# Patient Record
Sex: Female | Born: 1960 | Race: Black or African American | Hispanic: No | Marital: Single | Smoking: Never smoker
Health system: Southern US, Community
[De-identification: ages and names within clinical notes are randomized; demographics above are authoritative.]

## PROBLEM LIST (undated history)

## (undated) DIAGNOSIS — J45909 Unspecified asthma, uncomplicated: Secondary | ICD-10-CM

## (undated) DIAGNOSIS — I4891 Unspecified atrial fibrillation: Secondary | ICD-10-CM

## (undated) DIAGNOSIS — J449 Chronic obstructive pulmonary disease, unspecified: Secondary | ICD-10-CM

## (undated) DIAGNOSIS — I340 Nonrheumatic mitral (valve) insufficiency: Secondary | ICD-10-CM

## (undated) DIAGNOSIS — K5792 Diverticulitis of intestine, part unspecified, without perforation or abscess without bleeding: Secondary | ICD-10-CM

## (undated) DIAGNOSIS — I10 Essential (primary) hypertension: Secondary | ICD-10-CM

## (undated) DIAGNOSIS — M1990 Unspecified osteoarthritis, unspecified site: Secondary | ICD-10-CM

## (undated) DIAGNOSIS — G4730 Sleep apnea, unspecified: Secondary | ICD-10-CM

## (undated) DIAGNOSIS — T7840XA Allergy, unspecified, initial encounter: Secondary | ICD-10-CM

## (undated) HISTORY — PX: OTHER SURGICAL HISTORY: SHX169

## (undated) HISTORY — PX: ABDOMINAL HYSTERECTOMY: SHX81

## (undated) HISTORY — DX: Allergy, unspecified, initial encounter: T78.40XA

## (undated) HISTORY — DX: Essential (primary) hypertension: I10

## (undated) HISTORY — DX: Unspecified asthma, uncomplicated: J45.909

## (undated) HISTORY — PX: HEEL SPUR EXCISION: SHX1733

## (undated) HISTORY — DX: Sleep apnea, unspecified: G47.30

---

## 2004-10-09 ENCOUNTER — Inpatient Hospital Stay: Payer: Self-pay | Admitting: Obstetrics & Gynecology

## 2004-12-06 ENCOUNTER — Emergency Department: Payer: Self-pay | Admitting: Internal Medicine

## 2005-02-20 ENCOUNTER — Emergency Department: Payer: Self-pay | Admitting: Emergency Medicine

## 2005-12-12 ENCOUNTER — Emergency Department: Payer: Self-pay | Admitting: Emergency Medicine

## 2006-06-03 ENCOUNTER — Emergency Department: Payer: Self-pay | Admitting: Emergency Medicine

## 2006-08-29 ENCOUNTER — Ambulatory Visit: Payer: Self-pay | Admitting: Orthopedic Surgery

## 2006-09-26 ENCOUNTER — Ambulatory Visit: Payer: Self-pay | Admitting: Orthopedic Surgery

## 2006-10-09 ENCOUNTER — Ambulatory Visit: Payer: Self-pay | Admitting: Orthopedic Surgery

## 2006-10-11 ENCOUNTER — Emergency Department: Payer: Self-pay | Admitting: Unknown Physician Specialty

## 2006-10-12 ENCOUNTER — Other Ambulatory Visit: Payer: Self-pay

## 2006-12-26 ENCOUNTER — Emergency Department: Payer: Self-pay | Admitting: Emergency Medicine

## 2007-08-14 ENCOUNTER — Emergency Department: Payer: Self-pay | Admitting: Internal Medicine

## 2007-08-20 ENCOUNTER — Emergency Department: Payer: Self-pay | Admitting: Emergency Medicine

## 2008-03-26 ENCOUNTER — Emergency Department: Payer: Self-pay

## 2008-03-26 ENCOUNTER — Other Ambulatory Visit: Payer: Self-pay

## 2008-09-04 ENCOUNTER — Emergency Department: Payer: Self-pay | Admitting: Emergency Medicine

## 2012-08-26 ENCOUNTER — Emergency Department: Payer: Self-pay | Admitting: Internal Medicine

## 2012-08-26 LAB — BASIC METABOLIC PANEL
Anion Gap: 7 (ref 7–16)
BUN: 20 mg/dL — ABNORMAL HIGH (ref 7–18)
Calcium, Total: 9 mg/dL (ref 8.5–10.1)
Co2: 30 mmol/L (ref 21–32)
Glucose: 101 mg/dL — ABNORMAL HIGH (ref 65–99)
Osmolality: 284 (ref 275–301)
Sodium: 141 mmol/L (ref 136–145)

## 2012-08-26 LAB — CBC
HGB: 13.1 g/dL (ref 12.0–16.0)
MCH: 27.5 pg (ref 26.0–34.0)
MCHC: 32.6 g/dL (ref 32.0–36.0)
MCV: 84 fL (ref 80–100)
RBC: 4.75 10*6/uL (ref 3.80–5.20)
RDW: 15.1 % — ABNORMAL HIGH (ref 11.5–14.5)
WBC: 8.4 10*3/uL (ref 3.6–11.0)

## 2013-09-20 ENCOUNTER — Emergency Department: Payer: Self-pay | Admitting: Emergency Medicine

## 2013-11-13 ENCOUNTER — Emergency Department: Payer: Self-pay | Admitting: Emergency Medicine

## 2013-11-22 ENCOUNTER — Ambulatory Visit (INDEPENDENT_AMBULATORY_CARE_PROVIDER_SITE_OTHER): Payer: No Typology Code available for payment source

## 2013-11-22 ENCOUNTER — Ambulatory Visit (INDEPENDENT_AMBULATORY_CARE_PROVIDER_SITE_OTHER): Payer: No Typology Code available for payment source | Admitting: Podiatry

## 2013-11-22 ENCOUNTER — Encounter: Payer: Self-pay | Admitting: Podiatry

## 2013-11-22 ENCOUNTER — Other Ambulatory Visit: Payer: Self-pay | Admitting: *Deleted

## 2013-11-22 VITALS — BP 142/92 | HR 85 | Resp 16 | Ht 62.0 in | Wt 315.0 lb

## 2013-11-22 DIAGNOSIS — M778 Other enthesopathies, not elsewhere classified: Secondary | ICD-10-CM

## 2013-11-22 DIAGNOSIS — M779 Enthesopathy, unspecified: Secondary | ICD-10-CM

## 2013-11-22 DIAGNOSIS — M7750 Other enthesopathy of unspecified foot: Secondary | ICD-10-CM

## 2013-11-22 DIAGNOSIS — Q6650 Congenital pes planus, unspecified foot: Secondary | ICD-10-CM

## 2013-11-22 DIAGNOSIS — M1990 Unspecified osteoarthritis, unspecified site: Secondary | ICD-10-CM

## 2013-11-22 MED ORDER — MELOXICAM 15 MG PO TABS: 15.0000 mg | ORAL_TABLET | Freq: Every day | ORAL | Status: DC

## 2013-11-22 NOTE — Progress Notes (Signed)
   Subjective:    Patient ID: Molly Rivera, female    DOB: 20-Mar-1961, 53 y.o.   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 Left foot the pain is going up the back of the leg. Having a hard time sleeping. Went to emergency room and they gave medication to help the pain   Foot Pain      Review of Systems  All other systems reviewed and are negative.      Objective:   Physical Exam: I have reviewed her past medical history medications allergies surgeries social history and review of systems. Pulses are strongly palpable bilateral. Neurologic sensorium is intact per since once the monofilament deep tendon reflexes are intact bilateral muscle strength is 5 over 5 dorsiflexors plantar flexors inverters everters all intrinsic musculature is intact. Orthopedic evaluation demonstrates all joints distal to the ankle have a full range of motion without crepitation. She has pes planus bilateral which flexible in nature but she does have pain of Lisfranc's joint on internal and external rotation of the forefoot. Radiographic evaluation demonstrates osteoarthritis with pes planus bilateral.        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  Plan: Injected dorsal aspect of the bilateral foot with Kenalog and local anesthetic today she was scanned for orthotics. And [facility]

## 2013-11-30 ENCOUNTER — Emergency Department: Payer: Self-pay | Admitting: Emergency Medicine

## 2013-11-30 LAB — CBC WITH DIFFERENTIAL/PLATELET
BASOS PCT: 0.5 %
Basophil #: 0.1 10*3/uL (ref 0.0–0.1)
Eosinophil #: 0.2 10*3/uL (ref 0.0–0.7)
Eosinophil %: 2.1 %
HCT: 38.9 % (ref 35.0–47.0)
HGB: 12.6 g/dL (ref 12.0–16.0)
LYMPHS ABS: 3.2 10*3/uL (ref 1.0–3.6)
LYMPHS PCT: 27.5 %
MCH: 27.9 pg (ref 26.0–34.0)
MCHC: 32.4 g/dL (ref 32.0–36.0)
MCV: 86 fL (ref 80–100)
Monocyte #: 0.8 x10 3/mm (ref 0.2–0.9)
Monocyte %: 7.1 %
NEUTROS ABS: 7.4 10*3/uL — AB (ref 1.4–6.5)
NEUTROS PCT: 62.8 %
PLATELETS: 269 10*3/uL (ref 150–440)
RBC: 4.52 10*6/uL (ref 3.80–5.20)
RDW: 16 % — AB (ref 11.5–14.5)
WBC: 11.7 10*3/uL — ABNORMAL HIGH (ref 3.6–11.0)

## 2013-11-30 LAB — BASIC METABOLIC PANEL
ANION GAP: 5 — AB (ref 7–16)
BUN: 20 mg/dL — ABNORMAL HIGH (ref 7–18)
Calcium, Total: 8.9 mg/dL (ref 8.5–10.1)
Chloride: 106 mmol/L (ref 98–107)
Co2: 27 mmol/L (ref 21–32)
Creatinine: 1.18 mg/dL (ref 0.60–1.30)
EGFR (African American): >60
GFR CALC NON AF AMER: 54 — AB
Glucose: 99 mg/dL (ref 65–99)
Osmolality: 278 (ref 275–301)
POTASSIUM: 3.9 mmol/L (ref 3.5–5.1)
Sodium: 138 mmol/L (ref 136–145)

## 2013-12-01 ENCOUNTER — Telehealth: Payer: Self-pay | Admitting: *Deleted

## 2013-12-01 NOTE — Telephone Encounter (Signed)
PT CALLED SAID SHE IS HAVING SPASMS IN BOTH OF HER FEET AND INTO HER LEFT LEG. SAID SHE IS TAKING MOBIC 7.5 MG FOR THE LAST 2 WEEKS GIVEN TO HER BY THE EMERGENCY ROOM AND YOU HAD GIVEN HER MOBIC 15 MG BUT SHE HAS NOT STARTED THEM AS OF NOW. SHE WENT TO THE EMERGENCY ROOM LAST NIGHT BUT THEY DO NOT KNOW WHY SHE IS HAVING THE SPASMS. WANTS TO KNOW IF THE MOBIC COULD BE THE CAUSE. STATED SHE HAD THE SPASMS REALLY BAD LAST NIGHT AND HAVING THEM OFF AND ON FOR THE LAST WEEK.

## 2013-12-01 NOTE — Telephone Encounter (Signed)
I would suggest that if that is the only thing new that she is taking then she should stop the medication.  Wait to see if the spasms subside.  I will follow up with her and we can consider changing the medication on that follow up.

## 2013-12-02 NOTE — Telephone Encounter (Signed)
Spoke with pt regarding mobic 7.5 mg. Pt states the only other medication she is taking is her hbp rx and has been taking that for quite a while. Per dr Al Corpushyatt told pt to stop taking the mobic and see if spasms subside and will see her at next appt and see about changing the mobic rx. Pt understood.

## 2013-12-03 ENCOUNTER — Telehealth: Payer: Self-pay | Admitting: Podiatry

## 2013-12-03 NOTE — Telephone Encounter (Signed)
Molly Rivera called this morning in reference to the mobic. She stated she had taken Mobic 15 mg before she spoke to [name] yesterday and that she had no spasm's yesterday. I relayed this information to [facility] and she said it would be okay for Molly Rivera to continue the Mobic 15 mg as long as she had no spasum's. If she started having them again to discontinue medication and contact our office.

## 2013-12-06 ENCOUNTER — Encounter: Payer: Self-pay | Admitting: *Deleted

## 2013-12-06 NOTE — Progress Notes (Signed)
Sent pt post card letting her know orthotics are here. 

## 2013-12-16 ENCOUNTER — Other Ambulatory Visit: Payer: Self-pay | Admitting: *Deleted

## 2013-12-16 ENCOUNTER — Telehealth: Payer: Self-pay | Admitting: *Deleted

## 2013-12-16 MED ORDER — DICLOFENAC SODIUM 75 MG PO TBEC: 75.0000 mg | DELAYED_RELEASE_TABLET | Freq: Two times a day (BID) | ORAL | Status: DC

## 2013-12-16 MED ORDER — PREDNISONE 10 MG PO KIT: PACK | ORAL | Status: DC

## 2013-12-16 NOTE — Telephone Encounter (Signed)
Pt called and is wanting pain medicine for her feet. Can not get an appt till 6.15.15 to pick up orthotics with dr Al Corpus. She was given mobic and pt was thinking it was giving her spasms. She stopped mobic but still is having spasms. What do you suggest?

## 2013-12-16 NOTE — Telephone Encounter (Signed)
SENT IN STERAPRED KIT AND DICLOFENAC TO [facility] BY DR Valentina Lucks.

## 2013-12-16 NOTE — Telephone Encounter (Signed)
mobic does not cause spasms so its likely from her foot problem versus a medication.  I can put her on a sterapred kit followed by diclofenac antiinflammatory.  No narcotics will be written for pain .

## 2014-01-03 ENCOUNTER — Ambulatory Visit (INDEPENDENT_AMBULATORY_CARE_PROVIDER_SITE_OTHER): Payer: No Typology Code available for payment source | Admitting: Podiatry

## 2014-01-03 ENCOUNTER — Encounter: Payer: Self-pay | Admitting: Podiatry

## 2014-01-03 DIAGNOSIS — Q6650 Congenital pes planus, unspecified foot: Secondary | ICD-10-CM

## 2014-01-03 NOTE — Patient Instructions (Signed)

## 2014-01-03 NOTE — Progress Notes (Signed)
Both oral and written home-going instructions were provided for the use of her orthotics. I will followup with her in one month.

## 2014-02-07 ENCOUNTER — Ambulatory Visit: Payer: No Typology Code available for payment source | Admitting: Podiatry

## 2014-05-16 ENCOUNTER — Emergency Department: Payer: Self-pay | Admitting: Emergency Medicine

## 2014-06-05 ENCOUNTER — Emergency Department: Payer: Self-pay | Admitting: Emergency Medicine

## 2014-06-25 ENCOUNTER — Emergency Department: Payer: Self-pay | Admitting: Student

## 2014-09-20 ENCOUNTER — Emergency Department: Payer: Self-pay | Admitting: Emergency Medicine

## 2014-12-08 ENCOUNTER — Emergency Department
Admission: EM | Admit: 2014-12-08 | Discharge: 2014-12-08 | Disposition: A | Discharge status: Home / Self Care | Payer: No Typology Code available for payment source | Attending: Emergency Medicine | Admitting: Emergency Medicine

## 2014-12-08 ENCOUNTER — Encounter: Payer: Self-pay | Admitting: Emergency Medicine

## 2014-12-08 DIAGNOSIS — Y9389 Activity, other specified: Secondary | ICD-10-CM | POA: Diagnosis not present

## 2014-12-08 DIAGNOSIS — G5621 Lesion of ulnar nerve, right upper limb: Secondary | ICD-10-CM | POA: Insufficient documentation

## 2014-12-08 DIAGNOSIS — Y990 Civilian activity done for income or pay: Secondary | ICD-10-CM | POA: Diagnosis not present

## 2014-12-08 DIAGNOSIS — I10 Essential (primary) hypertension: Secondary | ICD-10-CM | POA: Diagnosis not present

## 2014-12-08 DIAGNOSIS — Y9289 Other specified places as the place of occurrence of the external cause: Secondary | ICD-10-CM | POA: Insufficient documentation

## 2014-12-08 DIAGNOSIS — X58XXXA Exposure to other specified factors, initial encounter: Secondary | ICD-10-CM | POA: Diagnosis not present

## 2014-12-08 DIAGNOSIS — S59911A Unspecified injury of right forearm, initial encounter: Secondary | ICD-10-CM | POA: Diagnosis present

## 2014-12-08 HISTORY — DX: Unspecified osteoarthritis, unspecified site: M19.90

## 2014-12-08 MED ORDER — PREDNISONE 10 MG (21) PO TBPK: ORAL_TABLET | ORAL | Status: DC

## 2014-12-08 MED ORDER — TRAMADOL HCL 50 MG PO TABS: 50.0000 mg | ORAL_TABLET | Freq: Four times a day (QID) | ORAL | Status: DC | PRN

## 2014-12-08 NOTE — ED Provider Notes (Signed)
[facility] Emergency Department Provider Note  ____________________________________________  Time seen: Approximately 1:36 PM  I have reviewed the triage vital signs and the nursing notes.   HISTORY  Chief Complaint Arm Injury    HPI Oneida Alaratricia A Lahaie is a 54 y.o. female who complains of right arm pain from her elbow to her hand. She describes the pain as burning and shooting. She states that she has an associated dull ache just above the elbow. She states that the pain started last night after she lifted a box of gloves while at work. She is currently taking Flexeril and ibuprofen for another injury not associated with this visit. She has not taken that medication today.Certainly movements make the pain worse.   Past Medical History  Diagnosis Date  . Arthritis   . Hypertension   . Sleep apnea     There are no active problems to display for this patient.   Past Surgical History  Procedure Laterality Date  . Abdominal hysterectomy      Current Outpatient Rx  Name  Route  Sig  Dispense  Refill  . predniSONE (STERAPRED UNI-PAK 21 TAB) 10 MG (21) TBPK tablet      Take 6 tablets on day 1 Take 5 tablets on day 2 Take 4 tablets on day 3 Take 3 tablets on day 4 Take 2 tablets on day 5 Take 1 tablet on day 6   21 tablet   0   . traMADol (ULTRAM) 50 MG tablet   Oral   Take 1 tablet (50 mg total) by mouth every 6 (six) hours as needed.   9 tablet   0     Allergies Allegra and Claritin  History reviewed. No pertinent family history.  Social History History  Substance Use Topics  . Smoking status: Never Smoker   . Smokeless tobacco: Not on file  . Alcohol Use: Yes    Review of Systems Constitutional: Recent fall while at work. Eyes: No visual changes. ENT: No sore throat. Cardiovascular: Denies chest pain or palpitations. Respiratory: Denies shortness of breath. Gastrointestinal: No abdominal pain.  Genitourinary: Negative for  dysuria. Musculoskeletal: Pain in right lower arm. Skin: Negative for rash. Neurological: Negative for headaches, focal weakness or numbness. 10-point ROS otherwise negative.  ____________________________________________   PHYSICAL EXAM:  VITAL SIGNS: ED Triage Vitals  Enc Vitals Group     BP 12/08/14 1314 185/109 mmHg     Pulse Rate 12/08/14 1314 82     Resp 12/08/14 1314 20     Temp 12/08/14 1314 98 F (36.7 C)     Temp Source 12/08/14 1314 Oral     SpO2 12/08/14 1314 98 %     Weight 12/08/14 1315 315 lb (142.883 kg)     Height 12/08/14 1314 5\' 3"  (1.6 m)     Head Cir --      Peak Flow --      Pain Score 12/08/14 1316 8     Pain Loc --      Pain Edu? --      Excl. in GC? --     Constitutional: Alert and oriented. Well appearing and in no acute distress. Eyes: Conjunctivae are normal. EOMI. Head: Atraumatic. Nose: No congestion/rhinnorhea. Neck: No stridor.  Respiratory: Normal respiratory effort.   Musculoskeletal: Tinel test positive on right. Atraumatic appearing. Neurologic:  Normal speech and language. No gross focal neurologic deficits are appreciated. Speech is normal. No gait instability. Skin:  Skin is warm, dry  and intact. Atraumatic. Psychiatric: Mood and affect are normal. Speech and behavior are normal.  ____________________________________________   LABS (all labs ordered are listed, but only abnormal results are displayed)  Labs Reviewed - No data to display ____________________________________________  RADIOLOGY  ____________________________________________   PROCEDURES  Procedure(s) performed: Velcro wrist splint applied by RN. CMS intact post application   ____________________________________________   INITIAL IMPRESSION / ASSESSMENT AND PLAN / ED COURSE  Pertinent labs & imaging results that were available during my care of the patient were reviewed by me and considered in my medical decision making (see chart for  details).  Patient was advised to follow up with orthopedics for symptoms that are not improving over the week. She was advised to return to the emergency department for symptoms that change or worsen if simple schedule appointment. ____________________________________________   FINAL CLINICAL IMPRESSION(S) / ED DIAGNOSES  Final diagnoses:  Ulnar nerve compression, right      Chinita PesterCari B Elisse Pennick, FNP 12/08/14 1737

## 2014-12-08 NOTE — Discharge Instructions (Signed)

## 2014-12-08 NOTE — ED Notes (Signed)
Pt reports that she fell on her job 11/16/14, she filed WC, she is on restriction, some have stopped but other are on her. She was at work last night, and thinks that she may have injured her arm. She has burning down her right arm. She went to [facility] and they did not help her so she came here to be evaluated.

## 2014-12-08 NOTE — ED Notes (Signed)
Pt c/o right arm pain mostly below elbow but occasionally shooting up and into the shoulder area.  Pain is burning and throbbing rated as 8/10 and has not been treated with any medications.  Pt is already taking ibuprofen 800 mg and Flexeril 10 mg for a different injury but has not taken those medications today.  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  Her current work restrictions for prior injury include no squatting, kneeling, or bending.

## 2014-12-21 ENCOUNTER — Emergency Department: Payer: No Typology Code available for payment source

## 2014-12-21 ENCOUNTER — Emergency Department
Admission: EM | Admit: 2014-12-21 | Discharge: 2014-12-21 | Disposition: A | Discharge status: Home / Self Care | Payer: No Typology Code available for payment source | Attending: Emergency Medicine | Admitting: Emergency Medicine

## 2014-12-21 ENCOUNTER — Other Ambulatory Visit: Payer: Self-pay

## 2014-12-21 DIAGNOSIS — I10 Essential (primary) hypertension: Secondary | ICD-10-CM | POA: Insufficient documentation

## 2014-12-21 DIAGNOSIS — R079 Chest pain, unspecified: Secondary | ICD-10-CM | POA: Diagnosis present

## 2014-12-21 LAB — BASIC METABOLIC PANEL
Anion gap: 8 (ref 5–15)
BUN: 17 mg/dL (ref 6–20)
CHLORIDE: 108 mmol/L (ref 101–111)
CO2: 26 mmol/L (ref 22–32)
CREATININE: 0.87 mg/dL (ref 0.44–1.00)
Calcium: 9.2 mg/dL (ref 8.9–10.3)
GFR calc Af Amer: >60 mL/min (ref >60)
GLUCOSE: 128 mg/dL — AB (ref 65–99)
Potassium: 3.3 mmol/L — ABNORMAL LOW (ref 3.5–5.1)
SODIUM: 142 mmol/L (ref 135–145)

## 2014-12-21 LAB — FIBRIN DERIVATIVES D-DIMER (ARMC ONLY): FIBRIN DERIVATIVES D-DIMER (ARMC): 521.55 — AB (ref 0–499)

## 2014-12-21 LAB — TROPONIN I: Troponin I: <0.03 ng/mL (ref <0.031)

## 2014-12-21 LAB — CBC
HEMATOCRIT: 39.8 % (ref 35.0–47.0)
HEMOGLOBIN: 12.7 g/dL (ref 12.0–16.0)
MCH: 27.1 pg (ref 26.0–34.0)
MCHC: 32 g/dL (ref 32.0–36.0)
MCV: 84.8 fL (ref 80.0–100.0)
PLATELETS: 233 10*3/uL (ref 150–440)
RBC: 4.7 MIL/uL (ref 3.80–5.20)
RDW: 15 % — ABNORMAL HIGH (ref 11.5–14.5)
WBC: 8.6 10*3/uL (ref 3.6–11.0)

## 2014-12-21 MED ORDER — ONDANSETRON 4 MG PO TBDP
ORAL_TABLET | ORAL | Status: AC
  Filled 2014-12-21: qty 1

## 2014-12-21 MED ORDER — SODIUM CHLORIDE 0.9 % IV BOLUS (SEPSIS)
1000.0000 mL | Freq: Once | INTRAVENOUS | Status: AC
  Administered 2014-12-21: 1000 mL via INTRAVENOUS

## 2014-12-21 MED ORDER — IOHEXOL 350 MG/ML SOLN
100.0000 mL | Freq: Once | INTRAVENOUS | Status: AC | PRN
  Administered 2014-12-21: 100 mL via INTRAVENOUS
  Filled 2014-12-21: qty 100

## 2014-12-21 MED ORDER — ONDANSETRON 4 MG PO TBDP
ORAL_TABLET | ORAL | Status: DC
Start: 2014-12-21 — End: 2014-12-21
  Filled 2014-12-21: qty 1

## 2014-12-21 NOTE — ED Notes (Signed)
Pt c/o sudden onset central area chest pain that started while sitting on computer at rest..denies pain at present..states she immediately took ASA 81mg 

## 2014-12-21 NOTE — Discharge Instructions (Signed)
Chest Pain (Nonspecific) Continue to take 1 baby aspirin, 81 mg, every day. It is often hard to give a diagnosis for the cause of chest pain. There is always a chance that your pain could be related to something serious, such as a heart attack or a blood clot in the lungs. You need to follow up with your doctor. HOME CARE  If antibiotic medicine was given, take it as directed by your doctor. Finish the medicine even if you start to feel better.  For the next few days, avoid activities that bring on chest pain. Continue physical activities as told by your doctor.  Do not use any tobacco products. This includes cigarettes, chewing tobacco, and e-cigarettes.  Avoid drinking alcohol.  Only take medicine as told by your doctor.  Follow your doctor's suggestions for more testing if your chest pain does not go away.  Keep all doctor visits you made. GET HELP IF:  Your chest pain does not go away, even after treatment.  You have a rash with blisters on your chest.  You have a fever. GET HELP RIGHT AWAY IF:   You have more pain or pain that spreads to your arm, neck, jaw, back, or belly (abdomen).  You have shortness of breath.  You cough more than usual or cough up blood.  You have very bad back or belly pain.  You feel sick to your stomach (nauseous) or throw up (vomit).  You have very bad weakness.  You pass out (faint).  You have chills. This is an emergency. Do not wait to see if the problems will go away. Call your local emergency services (911 in [place] Do not drive yourself to the hospital. MAKE SURE YOU:   Understand these instructions.  Will watch your condition.  Will get help right away if you are not doing well or get worse. Document Released: 12/25/2007 Document Revised: 07/13/2013 Document Reviewed: 12/25/2007 [facility] Patient Information 2015 [employer] [place] [place]. This information is not intended to replace advice given to you by your health care provider. Make  sure you discuss any questions you have with your health care provider.

## 2014-12-21 NOTE — ED Notes (Signed)
Patient denies pain and is resting comfortably.  

## 2014-12-21 NOTE — ED Provider Notes (Addendum)
[facility] Emergency Department Provider Note  ____________________________________________  Time seen: Approximately 2: 15 PM  I have reviewed the triage vital signs and the nursing notes.   HISTORY  Chief Complaint Chest Pain    HPI Molly Rivera is a 54 y.o. female with a history of hypertension and sleep apnea presents today with a 10 minute episode of chest pain which ended at 10:30 this morning. She says that she was sitting at a table working on a computer when she is sudden onset of pressure to her left chest. It was associated with nausea and shortness of breath but no diaphoresis. She quickly took 81 mg of aspirin and said the pain dissipated quickly. She is not complaining of any pain at this time. She said that she had similar symptoms about a year ago when [facility] with the [facility] was given a nitroglycerin and the pain resolved. Of note, the patient is adopted and does not know her complete family history.The pain was nonradiating. Nonexertional   Past Medical History  Diagnosis Date  . Arthritis   . Hypertension   . Sleep apnea     There are no active problems to display for this patient.   Past Surgical History  Procedure Laterality Date  . Abdominal hysterectomy    . Heel spur excision Left     Current Outpatient Rx  Name  Route  Sig  Dispense  Refill  . predniSONE (STERAPRED UNI-PAK 21 TAB) 10 MG (21) TBPK tablet      Take 6 tablets on day 1 Take 5 tablets on day 2 Take 4 tablets on day 3 Take 3 tablets on day 4 Take 2 tablets on day 5 Take 1 tablet on day 6   21 tablet   0   . traMADol (ULTRAM) 50 MG tablet   Oral   Take 1 tablet (50 mg total) by mouth every 6 (six) hours as needed.   9 tablet   0     Allergies Allegra and Claritin  No family history on file.  Social History History  Substance Use Topics  . Smoking status: Never Smoker   . Smokeless tobacco: Never Used  . Alcohol Use: Yes     Review of Systems Constitutional: No fever/chills Eyes: No visual changes. ENT: No sore throat. Cardiovascular: As above  Respiratory: As above  Gastrointestinal: No abdominal pain.  No nausea, no vomiting.  No diarrhea.  No constipation. Genitourinary: Negative for dysuria. Musculoskeletal: Negative for back pain. Skin: Negative for rash. Neurological: Negative for headaches, focal weakness or numbness.  10-point ROS otherwise negative.  ____________________________________________   PHYSICAL EXAM:  VITAL SIGNS: ED Triage Vitals  Enc Vitals Group     BP 12/21/14 1130 171/96 mmHg     Pulse Rate 12/21/14 1130 71     Resp 12/21/14 1130 18     Temp 12/21/14 1130 98.2 F (36.8 C)     Temp Source 12/21/14 1130 Oral     SpO2 12/21/14 1130 98 %     Weight 12/21/14 1130 315 lb (142.883 kg)     Height 12/21/14 1130 5\' 3"  (1.6 m)     Head Cir --      Peak Flow --      Pain Score 12/21/14 1132 3     Pain Loc --      Pain Edu? --      Excl. in GC? --     Constitutional: Alert and oriented.  Well appearing and in no acute distress. Eyes: Conjunctivae are normal. PERRL. EOMI. Head: Atraumatic. Nose: No congestion/rhinnorhea. Mouth/Throat: Mucous membranes are moist.  Oropharynx non-erythematous. Neck: No stridor.   Cardiovascular: Normal rate, regular rhythm. Grossly normal heart sounds.  Good peripheral circulation. Respiratory: Normal respiratory effort.  No retractions. Lungs CTAB. Gastrointestinal: Soft and nontender. No distention. No abdominal bruits. No CVA tenderness. Musculoskeletal: No lower extremity tenderness nor edema.  No joint effusions. Neurologic:  Normal speech and language. No gross focal neurologic deficits are appreciated. Speech is normal. No gait instability. Skin:  Skin is warm, dry and intact. No rash noted. Psychiatric: Mood and affect are normal. Speech and behavior are normal.  ____________________________________________   LABS (all labs  ordered are listed, but only abnormal results are displayed)  Labs Reviewed  CBC - Abnormal; Notable for the following:    RDW 15.0 (*)    All other components within normal limits  BASIC METABOLIC PANEL - Abnormal; Notable for the following:    Potassium 3.3 (*)    Glucose, Bld 128 (*)    All other components within normal limits  TROPONIN I  FIBRIN DERIVATIVES D-DIMER ([facility] ONLY)  TROPONIN I   ____________________________________________  EKG  ED ECG REPORT I, Arelia Longest, the attending physician, personally viewed and interpreted this ECG.   Date: 12/21/2014  EKG Time: 1126  Rate: 73  Rhythm: normal sinus rhythm  Axis: Normal axis.  Intervals:none  ST&T Change: No ST elevation or depressions. There is no STEMI. No abnormal T-wave inversions. Minimal voltage criteria for LVH which may be a normal variant.  ____________________________________________  RADIOLOGY  Chest x-ray NAD.  Negative for pulmonary emboli on CT of the chest. Linear atelectasis or scarring of the lung bases most notable in the left lower lobe. No evidence of pneumonia. ____________________________________________   PROCEDURES    ____________________________________________   INITIAL IMPRESSION / ASSESSMENT AND PLAN / ED COURSE  Pertinent labs & imaging results that were available during my care of the patient were reviewed by me and considered in my medical decision making (see chart for details).  Discussed with patient and recommendation to follow-up with the cardiologist given 54 years old with hypertension and obesity and several episodes of chest pain over the past year. Patient understands the plan and is willing to comply. Will be given phone number for on call cardiology to call for follow-up in the office.   ----------------------------------------- 4:40 PM on 12/21/2014 -----------------------------------------  Patient resting comfortable at this time without further  episodes of chest pain. Reassuring workup with 2 negative troponins and reassuring EKG. Patient is 54 years old with hypertension and obesity. Does have risk factors for CAD. Discussed following up with cardiologist and her primary care doctor. Patient is on the phone with her primary care doctor's office right now trying to make an appointment. I advised her that she'll need to be seen within the next 2 days for evaluation for a stress test. I will also give her the phone number for the cardiology office for follow-up. The patient understands this plan and is willing to comply. ____________________________________________   FINAL CLINICAL IMPRESSION(S) / ED DIAGNOSES  Acute chest pain. Resolved. Initial visit.    Myrna Blazer, MD 12/21/14 [phone]  Discussed with patient taking baby aspirin daily. Patient understands plan and willing to comply with this.  Myrna Blazer, MD 12/21/14 [phone]

## 2015-03-02 ENCOUNTER — Encounter: Payer: Self-pay | Admitting: Emergency Medicine

## 2015-03-02 ENCOUNTER — Emergency Department: Payer: No Typology Code available for payment source

## 2015-03-02 ENCOUNTER — Emergency Department
Admission: EM | Admit: 2015-03-02 | Discharge: 2015-03-03 | Disposition: A | Discharge status: Home / Self Care | Payer: No Typology Code available for payment source | Attending: Emergency Medicine | Admitting: Emergency Medicine

## 2015-03-02 DIAGNOSIS — R1011 Right upper quadrant pain: Secondary | ICD-10-CM

## 2015-03-02 DIAGNOSIS — Z7982 Long term (current) use of aspirin: Secondary | ICD-10-CM | POA: Insufficient documentation

## 2015-03-02 DIAGNOSIS — K5730 Diverticulosis of large intestine without perforation or abscess without bleeding: Secondary | ICD-10-CM | POA: Diagnosis not present

## 2015-03-02 DIAGNOSIS — Z791 Long term (current) use of non-steroidal anti-inflammatories (NSAID): Secondary | ICD-10-CM | POA: Diagnosis not present

## 2015-03-02 DIAGNOSIS — Z79811 Long term (current) use of aromatase inhibitors: Secondary | ICD-10-CM | POA: Diagnosis not present

## 2015-03-02 DIAGNOSIS — I10 Essential (primary) hypertension: Secondary | ICD-10-CM | POA: Diagnosis not present

## 2015-03-02 DIAGNOSIS — Z79899 Other long term (current) drug therapy: Secondary | ICD-10-CM | POA: Insufficient documentation

## 2015-03-02 LAB — URINALYSIS COMPLETE WITH MICROSCOPIC (ARMC ONLY)
Bacteria, UA: NONE SEEN
Bilirubin Urine: NEGATIVE
Glucose, UA: NEGATIVE mg/dL
Hgb urine dipstick: NEGATIVE
KETONES UR: NEGATIVE mg/dL
Leukocytes, UA: NEGATIVE
Nitrite: NEGATIVE
PH: 6 (ref 5.0–8.0)
Protein, ur: NEGATIVE mg/dL
Specific Gravity, Urine: 1.016 (ref 1.005–1.030)

## 2015-03-02 LAB — CBC WITH DIFFERENTIAL/PLATELET
Basophils Absolute: 0 10*3/uL (ref 0–0.1)
Basophils Relative: 1 %
Eosinophils Absolute: 0.1 10*3/uL (ref 0–0.7)
Eosinophils Relative: 1 %
HCT: 39 % (ref 35.0–47.0)
HEMOGLOBIN: 12.6 g/dL (ref 12.0–16.0)
LYMPHS ABS: 2.1 10*3/uL (ref 1.0–3.6)
LYMPHS PCT: 32 %
MCH: 27.4 pg (ref 26.0–34.0)
MCHC: 32.3 g/dL (ref 32.0–36.0)
MCV: 84.8 fL (ref 80.0–100.0)
MONOS PCT: 7 %
Monocytes Absolute: 0.5 10*3/uL (ref 0.2–0.9)
NEUTROS ABS: 3.8 10*3/uL (ref 1.4–6.5)
Neutrophils Relative %: 59 %
Platelets: 217 10*3/uL (ref 150–440)
RBC: 4.6 MIL/uL (ref 3.80–5.20)
RDW: 15.4 % — ABNORMAL HIGH (ref 11.5–14.5)
WBC: 6.5 10*3/uL (ref 3.6–11.0)

## 2015-03-02 LAB — COMPREHENSIVE METABOLIC PANEL
ALT: 16 U/L (ref 14–54)
AST: 18 U/L (ref 15–41)
Albumin: 4 g/dL (ref 3.5–5.0)
Alkaline Phosphatase: 76 U/L (ref 38–126)
Anion gap: 7 (ref 5–15)
BUN: 15 mg/dL (ref 6–20)
CALCIUM: 9.4 mg/dL (ref 8.9–10.3)
CO2: 31 mmol/L (ref 22–32)
Chloride: 105 mmol/L (ref 101–111)
Creatinine, Ser: 0.98 mg/dL (ref 0.44–1.00)
GFR calc non Af Amer: >60 mL/min (ref >60)
GLUCOSE: 108 mg/dL — AB (ref 65–99)
Potassium: 3.3 mmol/L — ABNORMAL LOW (ref 3.5–5.1)
SODIUM: 143 mmol/L (ref 135–145)
TOTAL PROTEIN: 7.6 g/dL (ref 6.5–8.1)
Total Bilirubin: 0.6 mg/dL (ref 0.3–1.2)

## 2015-03-02 LAB — LIPASE, BLOOD: Lipase: 16 U/L — ABNORMAL LOW (ref 22–51)

## 2015-03-02 MED ORDER — MORPHINE SULFATE 2 MG/ML IJ SOLN
2.0000 mg | Freq: Once | INTRAMUSCULAR | Status: AC
  Administered 2015-03-03: 2 mg via INTRAVENOUS
  Filled 2015-03-02: qty 1

## 2015-03-02 MED ORDER — ONDANSETRON HCL 4 MG/2ML IJ SOLN
4.0000 mg | Freq: Once | INTRAMUSCULAR | Status: AC
  Administered 2015-03-03: 4 mg via INTRAVENOUS
  Filled 2015-03-02: qty 2

## 2015-03-02 MED ORDER — SODIUM CHLORIDE 0.9 % IV BOLUS (SEPSIS)
1000.0000 mL | Freq: Once | INTRAVENOUS | Status: AC
  Administered 2015-03-03: 1000 mL via INTRAVENOUS
  Filled 2015-03-02: qty 1000

## 2015-03-02 NOTE — ED Provider Notes (Signed)
[facility] Emergency Department Provider Note  ____________________________________________  Time seen: 11:30 PM  I have reviewed the triage vital signs and the nursing notes.   HISTORY  Chief Complaint Abdominal Pain      HPI Molly Rivera is a 54 y.o. female presents with upper abdominal pain current pain score of 5 out of 10 that is nonradiating. Patient states multiple episodes of nausea and vomiting. Patient states symptoms started after eating hot dogs. Of note symptom onset was Sunday hence 4 days duration.      Past Medical History  Diagnosis Date  . Arthritis   . Hypertension   . Sleep apnea   . Asthma     There are no active problems to display for this patient.   Past Surgical History  Procedure Laterality Date  . Abdominal hysterectomy    . Heel spur excision Left     Current Outpatient Rx  Name  Route  Sig  Dispense  Refill  . amLODipine (NORVASC) 10 MG tablet   Oral   Take 10 mg by mouth daily.         Marland Kitchen aspirin EC 81 MG tablet   Oral   Take 81 mg by mouth daily.         Marland Kitchen ibuprofen (ADVIL,MOTRIN) 800 MG tablet   Oral   Take 800 mg by mouth every 6 (six) hours as needed for moderate pain.         Marland Kitchen lisinopril (PRINIVIL,ZESTRIL) 10 MG tablet   Oral   Take 10 mg by mouth daily.         . predniSONE (STERAPRED UNI-PAK 21 TAB) 10 MG (21) TBPK tablet      Take 6 tablets on day 1 Take 5 tablets on day 2 Take 4 tablets on day 3 Take 3 tablets on day 4 Take 2 tablets on day 5 Take 1 tablet on day 6   21 tablet   0   . traMADol (ULTRAM) 50 MG tablet   Oral   Take 1 tablet (50 mg total) by mouth every 6 (six) hours as needed.   9 tablet   0     Allergies Allegra and Claritin  No family history on file.  Social History Social History  Substance Use Topics  . Smoking status: Never Smoker   . Smokeless tobacco: Never Used  . Alcohol Use: Yes    Review of Systems  Constitutional: Negative  for fever. Eyes: Negative for visual changes. ENT: Negative for sore throat. Cardiovascular: Negative for chest pain. Respiratory: Negative for shortness of breath. Gastrointestinal: Positive  for abdominal pain, vomiting and diarrhea. Genitourinary: Negative for dysuria. Musculoskeletal: Negative for back pain. Skin: Negative for rash. Neurological: Negative for headaches, focal weakness or numbness.   10-point ROS otherwise negative.  ____________________________________________   PHYSICAL EXAM:  VITAL SIGNS: ED Triage Vitals  Enc Vitals Group     BP 03/02/15 1850 144/97 mmHg     Pulse Rate 03/02/15 1850 66     Resp --      Temp 03/02/15 1850 98.2 F (36.8 C)     Temp Source 03/02/15 1850 Oral     SpO2 03/02/15 1850 98 %     Weight 03/02/15 1850 320 lb (145.151 kg)     Height 03/02/15 1850 5\' 2"  (1.575 m)     Head Cir --      Peak Flow --      Pain Score 03/02/15 1851 7  Pain Loc --      Pain Edu? --      Excl. in GC? --      Constitutional: Alert and oriented. Well appearing and in no distress. Eyes: Conjunctivae are normal. PERRL. Normal extraocular movements. ENT   Head: Normocephalic and atraumatic.   Nose: No congestion/rhinnorhea.   Mouth/Throat: Mucous membranes are moist.   Neck: No stridor. Cardiovascular: Normal rate, regular rhythm. Normal and symmetric distal pulses are present in all extremities. No murmurs, rubs, or gallops. Respiratory: Normal respiratory effort without tachypnea nor retractions. Breath sounds are clear and equal bilaterally. No wheezes/rales/rhonchi. Gastrointestinal:tender to palpation right upper quadrant/epigastrium.tention. There is no CVA tenderness. Genitourinary: deferred Musculoskeletal: Nontender with normal range of motion in all extremities. No joint effusions.  No lower extremity tenderness nor edema. Neurologic:  Normal speech and language. No gross focal neurologic deficits are appreciated. Speech is  normal.  Skin:  Skin is warm, dry and intact. No rash noted. Psychiatric: Mood and affect are normal. Speech and behavior are normal. Patient exhibits appropriate insight and judgment.  ____________________________________________    LABS (pertinent positives/negatives)  Labs Reviewed  LIPASE, BLOOD - Abnormal; Notable for the following:    Lipase 16 (*)    All other components within normal limits  CBC WITH DIFFERENTIAL/PLATELET - Abnormal; Notable for the following:    RDW 15.4 (*)    All other components within normal limits  COMPREHENSIVE METABOLIC PANEL - Abnormal; Notable for the following:    Potassium 3.3 (*)    Glucose, Bld 108 (*)    All other components within normal limits  URINALYSIS COMPLETEWITH MICROSCOPIC ([facility] ONLY) - Abnormal; Notable for the following:    Color, Urine YELLOW (*)    APPearance CLEAR (*)    Squamous Epithelial / LPF 0-5 (*)    All other components within normal limits     RADIOLOGY  CT scan revealed: CT Abdomen Pelvis W Contrast (Final result) Result time: 03/03/15 02:55:04   Final result by Rad Results In Interface (03/03/15 02:55:04)   Narrative:   CLINICAL DATA: Acute onset of dizziness and vomiting. Initial encounter.  EXAM: CT ABDOMEN AND PELVIS WITH CONTRAST  TECHNIQUE: Multidetector CT imaging of the abdomen and pelvis was performed using the standard protocol following bolus administration of intravenous contrast.  CONTRAST: OMNIPAQUE IOHEXOL 300 MG/ML SOLN  COMPARISON: Right upper quadrant ultrasound performed 03/02/2015  FINDINGS: Minimal left basilar atelectasis or scarring is noted.  The liver and spleen are unremarkable in appearance. The gallbladder is within normal limits. The pancreas and adrenal glands are unremarkable.  The kidneys are unremarkable in appearance. There is no evidence of hydronephrosis. No renal or ureteral stones are seen. No perinephric stranding is appreciated.  No free  fluid is identified. The small bowel is unremarkable in appearance. The stomach is within normal limits. No acute vascular abnormalities are seen.  The appendix is normal in caliber, without evidence of appendicitis. Scattered diverticulosis is noted along the distal transverse and descending colon, without evidence of diverticulitis.  The bladder is decompressed and not well assessed. The patient is status post hysterectomy. No suspicious adnexal masses are seen. No inguinal lymphadenopathy is seen.  No acute osseous abnormalities are identified. Facet disease is noted at the lower lumbar spine.  IMPRESSION: 1. No acute abnormality seen within the abdomen or pelvis. 2. Scattered diverticulosis along the distal transverse and descending colon, without evidence of diverticulitis.   Electronically Signed By: Roanna Raider M.D. On: 03/03/2015 02:55  _  INITIAL IMPRESSION / ASSESSMENT AND PLAN / ED COURSE  Pertinent labs & imaging results that were available during my care of the patient were reviewed by me and considered in my medical decision making (see chart for details).    ____________________________________________   FINAL CLINICAL IMPRESSION(S) / ED DIAGNOSES  Final diagnoses:  Diverticulosis of large intestine without hemorrhage      Darci Current, MD 03/03/15 [phone]

## 2015-03-02 NOTE — ED Notes (Signed)
States she has had intermittent abd pain with n/v/d and dizziness since Sunday pm

## 2015-03-02 NOTE — ED Notes (Signed)
Pt sts that she had one instance of dizziness while laying down Sunday, has thrown up once in past week and been able to eat/drink afterwards.  Pt sts "I just don't feel right".

## 2015-03-02 NOTE — ED Notes (Signed)
Patient returned from US.

## 2015-03-02 NOTE — ED Notes (Signed)
Report from Anna and Nellie, RN 

## 2015-03-03 ENCOUNTER — Emergency Department: Payer: No Typology Code available for payment source

## 2015-03-03 ENCOUNTER — Encounter: Payer: Self-pay | Admitting: Podiatry

## 2015-03-03 MED ORDER — IOHEXOL 300 MG/ML  SOLN
125.0000 mL | Freq: Once | INTRAMUSCULAR | Status: AC | PRN
  Administered 2015-03-03: 125 mL via INTRAVENOUS

## 2015-03-03 MED ORDER — IOHEXOL 240 MG/ML SOLN
25.0000 mL | Freq: Once | INTRAMUSCULAR | Status: AC | PRN
  Administered 2015-03-03: 25 mL via ORAL

## 2015-03-03 MED ORDER — ONDANSETRON 4 MG PO TBDP: 4.0000 mg | ORAL_TABLET | Freq: Three times a day (TID) | ORAL | Status: DC | PRN

## 2015-03-03 NOTE — Discharge Instructions (Signed)
Diverticulosis Diverticulosis is the condition that develops when small pouches (diverticula) form in the wall of your colon. Your colon, or large intestine, is where water is absorbed and stool is formed. The pouches form when the inside layer of your colon pushes through weak spots in the outer layers of your colon. CAUSES  No one knows exactly what causes diverticulosis. RISK FACTORS  Being older than 50. Your risk for this condition increases with age. Diverticulosis is rare in people younger than 40 years. By age 80, almost everyone has it.  Eating a low-fiber diet.  Being frequently constipated.  Being overweight.  Not getting enough exercise.  Smoking.  Taking over-the-counter pain medicines, like aspirin and ibuprofen. SYMPTOMS  Most people with diverticulosis do not have symptoms. DIAGNOSIS  Because diverticulosis often has no symptoms, health care providers often discover the condition during an exam for other colon problems. In many cases, a health care provider will diagnose diverticulosis while using a flexible scope to examine the colon (colonoscopy). TREATMENT  If you have never developed an infection related to diverticulosis, you may not need treatment. If you have had an infection before, treatment may include:  Eating more fruits, vegetables, and grains.  Taking a fiber supplement.  Taking a live bacteria supplement (probiotic).  Taking medicine to relax your colon. HOME CARE INSTRUCTIONS   Drink at least 6-8 glasses of water each day to prevent constipation.  Try not to strain when you have a bowel movement.  Keep all follow-up appointments. If you have had an infection before:  Increase the fiber in your diet as directed by your health care provider or dietitian.  Take a dietary fiber supplement if your health care provider approves.  Only take medicines as directed by your health care provider. SEEK MEDICAL CARE IF:   You have abdominal  pain.  You have bloating.  You have cramps.  You have not gone to the bathroom in 3 days. SEEK IMMEDIATE MEDICAL CARE IF:   Your pain gets worse.  Yourbloating becomes very bad.  You have a fever or chills, and your symptoms suddenly get worse.  You begin vomiting.  You have bowel movements that are bloody or black. MAKE SURE YOU:  Understand these instructions.  Will watch your condition.  Will get help right away if you are not doing well or get worse. Document Released: 04/04/2004 Document Revised: 07/13/2013 Document Reviewed: 06/02/2013 [employer] Patient Information 2015 [employer]. This information is not intended to replace advice given to you by your health care provider. Make sure you discuss any questions you have with your health care provider.  

## 2015-06-12 ENCOUNTER — Emergency Department: Payer: No Typology Code available for payment source

## 2015-06-12 ENCOUNTER — Encounter: Payer: Self-pay | Admitting: Emergency Medicine

## 2015-06-12 ENCOUNTER — Inpatient Hospital Stay
Admission: EM | Admit: 2015-06-12 | Discharge: 2015-06-14 | DRG: 309 | Disposition: A | Discharge status: Home / Self Care | Payer: No Typology Code available for payment source | Attending: Internal Medicine | Admitting: Internal Medicine

## 2015-06-12 ENCOUNTER — Observation Stay (HOSPITAL_BASED_OUTPATIENT_CLINIC_OR_DEPARTMENT_OTHER)
Admit: 2015-06-12 | Discharge: 2015-06-12 | Disposition: A | Discharge status: Home / Self Care | Payer: No Typology Code available for payment source | Attending: Internal Medicine | Admitting: Internal Medicine

## 2015-06-12 DIAGNOSIS — I4891 Unspecified atrial fibrillation: Secondary | ICD-10-CM | POA: Insufficient documentation

## 2015-06-12 DIAGNOSIS — R0600 Dyspnea, unspecified: Secondary | ICD-10-CM | POA: Diagnosis not present

## 2015-06-12 DIAGNOSIS — Z79899 Other long term (current) drug therapy: Secondary | ICD-10-CM

## 2015-06-12 DIAGNOSIS — Z888 Allergy status to other drugs, medicaments and biological substances status: Secondary | ICD-10-CM

## 2015-06-12 DIAGNOSIS — I10 Essential (primary) hypertension: Secondary | ICD-10-CM

## 2015-06-12 DIAGNOSIS — E876 Hypokalemia: Secondary | ICD-10-CM | POA: Diagnosis present

## 2015-06-12 DIAGNOSIS — I340 Nonrheumatic mitral (valve) insufficiency: Secondary | ICD-10-CM | POA: Diagnosis present

## 2015-06-12 DIAGNOSIS — E66813 Obesity, class 3: Secondary | ICD-10-CM

## 2015-06-12 DIAGNOSIS — I480 Paroxysmal atrial fibrillation: Principal | ICD-10-CM | POA: Diagnosis present

## 2015-06-12 DIAGNOSIS — E669 Obesity, unspecified: Secondary | ICD-10-CM

## 2015-06-12 DIAGNOSIS — G4733 Obstructive sleep apnea (adult) (pediatric): Secondary | ICD-10-CM

## 2015-06-12 DIAGNOSIS — J45909 Unspecified asthma, uncomplicated: Secondary | ICD-10-CM

## 2015-06-12 DIAGNOSIS — Z6841 Body Mass Index (BMI) 40.0 and over, adult: Secondary | ICD-10-CM

## 2015-06-12 HISTORY — DX: Morbid (severe) obesity due to excess calories: E66.01

## 2015-06-12 HISTORY — DX: Unspecified atrial fibrillation: I48.91

## 2015-06-12 HISTORY — DX: Diverticulitis of intestine, part unspecified, without perforation or abscess without bleeding: K57.92

## 2015-06-12 HISTORY — DX: Nonrheumatic mitral (valve) insufficiency: I34.0

## 2015-06-12 LAB — CBC WITH DIFFERENTIAL/PLATELET
Basophils Absolute: 0 10*3/uL (ref 0–0.1)
Basophils Relative: 1 %
EOS ABS: 0.1 10*3/uL (ref 0–0.7)
EOS PCT: 2 %
HCT: 42.8 % (ref 35.0–47.0)
HEMOGLOBIN: 13.9 g/dL (ref 12.0–16.0)
LYMPHS ABS: 2.1 10*3/uL (ref 1.0–3.6)
LYMPHS PCT: 32 %
MCH: 26.9 pg (ref 26.0–34.0)
MCHC: 32.5 g/dL (ref 32.0–36.0)
MCV: 82.7 fL (ref 80.0–100.0)
MONOS PCT: 8 %
Monocytes Absolute: 0.5 10*3/uL (ref 0.2–0.9)
NEUTROS PCT: 59 %
Neutro Abs: 4 10*3/uL (ref 1.4–6.5)
Platelets: 235 10*3/uL (ref 150–440)
RBC: 5.18 MIL/uL (ref 3.80–5.20)
RDW: 16.3 % — ABNORMAL HIGH (ref 11.5–14.5)
WBC: 6.8 10*3/uL (ref 3.6–11.0)

## 2015-06-12 LAB — BASIC METABOLIC PANEL
Anion gap: 8 (ref 5–15)
BUN: 14 mg/dL (ref 6–20)
CALCIUM: 9 mg/dL (ref 8.9–10.3)
CO2: 26 mmol/L (ref 22–32)
CREATININE: 0.72 mg/dL (ref 0.44–1.00)
Chloride: 108 mmol/L (ref 101–111)
GFR calc Af Amer: >60 mL/min (ref >60)
GLUCOSE: 109 mg/dL — AB (ref 65–99)
Potassium: 3.5 mmol/L (ref 3.5–5.1)
Sodium: 142 mmol/L (ref 135–145)

## 2015-06-12 LAB — BLOOD GAS, VENOUS
ACID-BASE EXCESS: 5.8 mmol/L — AB (ref 0.0–3.0)
BICARBONATE: 30.6 meq/L — AB (ref 21.0–28.0)
FIO2: 0.21
PATIENT TEMPERATURE: 37
pCO2, Ven: 44 mmHg (ref 44.0–60.0)
pH, Ven: 7.45 — ABNORMAL HIGH (ref 7.320–7.430)

## 2015-06-12 LAB — TSH: TSH: 1.213 u[IU]/mL (ref 0.350–4.500)

## 2015-06-12 LAB — TROPONIN I

## 2015-06-12 LAB — T4, FREE: FREE T4: 0.92 ng/dL (ref 0.61–1.12)

## 2015-06-12 LAB — BRAIN NATRIURETIC PEPTIDE: B Natriuretic Peptide: 145 pg/mL — ABNORMAL HIGH (ref 0.0–100.0)

## 2015-06-12 MED ORDER — HYDROMORPHONE HCL 1 MG/ML IJ SOLN: 2.0000 mg | Freq: Once | INTRAMUSCULAR | Status: DC

## 2015-06-12 MED ORDER — ONDANSETRON HCL 4 MG/2ML IJ SOLN
4.0000 mg | Freq: Once | INTRAMUSCULAR | Status: AC
  Administered 2015-06-12 (×2): 4 mg via INTRAVENOUS

## 2015-06-12 MED ORDER — AMLODIPINE BESYLATE 10 MG PO TABS
10.0000 mg | ORAL_TABLET | Freq: Every day | ORAL | Status: DC
Start: 2015-06-12 — End: 2015-06-13

## 2015-06-12 MED ORDER — ONDANSETRON HCL 4 MG PO TABS: 4.0000 mg | ORAL_TABLET | Freq: Four times a day (QID) | ORAL | Status: DC | PRN

## 2015-06-12 MED ORDER — ONDANSETRON HCL 4 MG/2ML IJ SOLN
INTRAMUSCULAR | Status: AC
  Administered 2015-06-12: 4 mg via INTRAVENOUS
  Filled 2015-06-12: qty 2

## 2015-06-12 MED ORDER — APIXABAN 5 MG PO TABS
5.0000 mg | ORAL_TABLET | Freq: Two times a day (BID) | ORAL | Status: DC
  Administered 2015-06-12 – 2015-06-14 (×4): 5 mg via ORAL
  Filled 2015-06-12 (×4): qty 1

## 2015-06-12 MED ORDER — IOHEXOL 350 MG/ML SOLN
75.0000 mL | Freq: Once | INTRAVENOUS | Status: AC | PRN
  Administered 2015-06-12: 100 mL via INTRAVENOUS

## 2015-06-12 MED ORDER — ONDANSETRON HCL 40 MG/20ML IJ SOLN
8.0000 mg | Freq: Once | INTRAMUSCULAR | Status: DC
  Filled 2015-06-12: qty 4

## 2015-06-12 MED ORDER — HYDROMORPHONE HCL 1 MG/ML IJ SOLN
1.0000 mg | Freq: Once | INTRAMUSCULAR | Status: AC
  Administered 2015-06-12: 1 mg via INTRAVENOUS
  Filled 2015-06-12: qty 1

## 2015-06-12 MED ORDER — LISINOPRIL 10 MG PO TABS
10.0000 mg | ORAL_TABLET | Freq: Every day | ORAL | Status: DC
  Administered 2015-06-13 – 2015-06-14 (×2): 10 mg via ORAL
  Filled 2015-06-12 (×2): qty 1

## 2015-06-12 MED ORDER — DILTIAZEM HCL 30 MG PO TABS: 30.0000 mg | ORAL_TABLET | Freq: Once | ORAL | Status: DC

## 2015-06-12 MED ORDER — ONDANSETRON HCL 4 MG/2ML IJ SOLN: 4.0000 mg | Freq: Four times a day (QID) | INTRAMUSCULAR | Status: DC | PRN

## 2015-06-12 MED ORDER — SODIUM CHLORIDE 0.9 % IJ SOLN: 3.0000 mL | INTRAMUSCULAR | Status: DC | PRN

## 2015-06-12 MED ORDER — DILTIAZEM HCL 25 MG/5ML IV SOLN
10.0000 mg | Freq: Once | INTRAVENOUS | Status: AC
  Administered 2015-06-12: 10 mg via INTRAVENOUS
  Filled 2015-06-12: qty 5

## 2015-06-12 MED ORDER — ACETAMINOPHEN 325 MG PO TABS
650.0000 mg | ORAL_TABLET | Freq: Four times a day (QID) | ORAL | Status: DC | PRN
  Administered 2015-06-12 – 2015-06-13 (×3): 650 mg via ORAL
  Filled 2015-06-12 (×3): qty 2

## 2015-06-12 MED ORDER — METOPROLOL TARTRATE 1 MG/ML IV SOLN: 5.0000 mg | Freq: Four times a day (QID) | INTRAVENOUS | Status: DC | PRN

## 2015-06-12 MED ORDER — SODIUM CHLORIDE 0.9 % IJ SOLN: 3.0000 mL | Freq: Two times a day (BID) | INTRAMUSCULAR | Status: DC

## 2015-06-12 MED ORDER — ACETAMINOPHEN 650 MG RE SUPP: 650.0000 mg | Freq: Four times a day (QID) | RECTAL | Status: DC | PRN

## 2015-06-12 NOTE — ED Notes (Signed)
Pt to xray, will reevaluate heart rate when pt returns for cardizem ordered.

## 2015-06-12 NOTE — Progress Notes (Signed)
Skin checked by Donivan ScullAmy Dalton Rn. Room air. A fib. A & O. Pt reports no pain. CT chest neg for PE. Pt has no further concerns at this time.

## 2015-06-12 NOTE — ED Provider Notes (Signed)
[facility] Emergency Department Provider Note     Time seen: ----------------------------------------- 10:41 AM on 06/12/2015 -----------------------------------------    I have reviewed the triage vital signs and the nursing notes.   HISTORY  Chief Complaint Shortness of Breath    HPI Molly Rivera is a 54 y.o. female who presents ER for shortness breath started this morning when she awoke. States she has history of asthma but she's never had acute asthma exacerbation. She reports increased dyspnea with exertion, denies any fevers, chills, chest pain, nausea vomiting or diarrhea. Patient does not note recent fluid or swelling.   Past Medical History  Diagnosis Date  . Allergy   . Arthritis   . Hypertension   . Sleep apnea   . Asthma     There are no active problems to display for this patient.   Past Surgical History  Procedure Laterality Date  . Knee surgery    . Abdominal hysterectomy    . Heel spur excision Left     Allergies Allegra and Claritin  Social History Social History  Substance Use Topics  . Smoking status: Never Smoker   . Smokeless tobacco: Never Used  . Alcohol Use: Yes    Review of Systems Constitutional: Negative for fever. Eyes: Negative for visual changes. ENT: Negative for sore throat. Cardiovascular: Negative for chest pain. Respiratory: Positive for shortness of breath Gastrointestinal: Negative for abdominal pain, vomiting and diarrhea. Genitourinary: Negative for dysuria. Musculoskeletal: Negative for back pain. Skin: Negative for rash. Neurological: Negative for headaches, focal weakness or numbness.  10-point ROS otherwise negative.  ____________________________________________   PHYSICAL EXAM:  VITAL SIGNS: ED Triage Vitals  Enc Vitals Group     BP 06/12/15 0946 129/90 mmHg     Pulse Rate 06/12/15 0946 51     Resp 06/12/15 0946 20     Temp 06/12/15 0946 97.5 F (36.4 C)     Temp  Source 06/12/15 0946 Oral     SpO2 06/12/15 0946 91 %     Weight 06/12/15 0946 315 lb (142.883 kg)     Height 06/12/15 0946 5\' 2"  (1.575 m)     Head Cir --      Peak Flow --      Pain Score 06/12/15 0946 5     Pain Loc --      Pain Edu? --      Excl. in GC? --     Constitutional: Alert and oriented. Well appearing and in no distress. Eyes: Conjunctivae are normal. PERRL. Normal extraocular movements. ENT   Head: Normocephalic and atraumatic.   Nose: No congestion/rhinnorhea.   Mouth/Throat: Mucous membranes are moist.   Neck: No stridor. Cardiovascular: Slow rate, regular rhythm. Normal and symmetric distal pulses are present in all extremities. No murmurs, rubs, or gallops. Respiratory: Normal respiratory effort without tachypnea nor retractions. Breath sounds are clear and equal bilaterally. No wheezes/rales/rhonchi. Gastrointestinal: Soft and nontender. No distention. No abdominal bruits.  Musculoskeletal: Nontender with normal range of motion in all extremities. No joint effusions.  No lower extremity tenderness nor edema. Neurologic:  Normal speech and language. No gross focal neurologic deficits are appreciated. Speech is normal. No gait instability. Skin:  Skin is warm, dry and intact. No rash noted. Psychiatric: Mood and affect are normal. Speech and behavior are normal. Patient exhibits appropriate insight and judgment. ____________________________________________  EKG: Interpreted by me. Atrial fibrillation with a rate of 134 bpm, normal axis, no evidence of hypertrophy or acute infarction. There is  low voltage.  ____________________________________________  ED COURSE:  Pertinent labs & imaging results that were available during my care of the patient were reviewed by me and considered in my medical decision making (see chart for details). Patient will need cardiac labs chest x-ray and reevaluation. Lungs are clear at this  time. ____________________________________________    LABS (pertinent positives/negatives)  Labs Reviewed  CBC WITH DIFFERENTIAL/PLATELET - Abnormal; Notable for the following:    RDW 16.3 (*)    All other components within normal limits  BLOOD GAS, VENOUS - Abnormal; Notable for the following:    pH, Ven 7.45 (*)    Bicarbonate 30.6 (*)    Acid-Base Excess 5.8 (*)    All other components within normal limits  BASIC METABOLIC PANEL - Abnormal; Notable for the following:    Glucose, Bld 109 (*)    All other components within normal limits  BRAIN NATRIURETIC PEPTIDE - Abnormal; Notable for the following:    B Natriuretic Peptide 145.0 (*)    All other components within normal limits  TROPONIN I  TSH  T4, FREE    RADIOLOGY Images were viewed by me  Chest x-ray/CT angiogram  IMPRESSION: No active cardiopulmonary disease.  IMPRESSION: No evidence of pulmonary emboli.  Likely left lower lobe scarring.  No other focal abnormality is seen. ____________________________________________  FINAL ASSESSMENT AND PLAN  Dyspnea, atrial fibrillation  Plan: Patient with labs and imaging as dictated above. Patient with new-onset A. fib. She will need anticoagulation and likely observation. No clear etiology for her shortness of breath other than the atrial fibrillation at this time.   Emily Filbert, MD   Emily Filbert, MD 06/12/15 [phone]

## 2015-06-12 NOTE — Progress Notes (Signed)
*  PRELIMINARY RESULTS* Echocardiogram 2D Echocardiogram has been performed.  Molly Rivera 06/12/2015, 7:05 PM

## 2015-06-12 NOTE — ED Notes (Signed)
Pt to ed with c/o sob that started this am when she awoke.  Pt states hx of asthma.   Pt states increased activity causes increased sob.

## 2015-06-12 NOTE — ED Notes (Signed)
Took patient cup of ice per nurse Nellie

## 2015-06-12 NOTE — H&P (Addendum)
Molly Rivera -  at [facility]   PATIENT NAME: Molly Rivera    MR#:  [id number]  DATE OF BIRTH:  1961-01-19  DATE OF ADMISSION:  06/12/2015  PRIMARY CARE PHYSICIAN: Sandrea Hughs, NP   REQUESTING/REFERRING PHYSICIAN: Dr. Daryel November  CHIEF COMPLAINT:   Chief Complaint  Patient presents with  . Shortness of Breath    HISTORY OF PRESENT ILLNESS:  Molly Rivera  is a 54 y.o. female with a known history of obesity, sleep apnea, hypertension, asthma, history of irregular heartbeat in the past presents to the hospital secondary to extreme weakness and palpitations since morning. She was noted to be in atrial fibrillation with heart rate in the 130s. Improved with one dose of IV Cardizem. Her rate is controlled, remains in A. fib. She says she was diagnosed with irregular heartbeat in the past but that never sustained. She does not take any metoprolol or Cardizem at home. 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 She is uses CPAP at nighttime for sleep apnea. This morning when she woke up she had palpitations by the time she got down stairs to the living room she felt extremely weak, fatigued had to sit down from and it. She also had some dyspnea. She does not take any anticoagulation for her paroxysmal atrial fibrillation. She is being admitted for observation for the same.  PAST MEDICAL HISTORY:   Past Medical History  Diagnosis Date  . Allergy   . Arthritis   . Hypertension   . Sleep apnea   . Asthma     PAST SURGICAL HISTORY:   Past Surgical History  Procedure Laterality Date  . Knee surgery    . Abdominal hysterectomy    . Heel spur excision Left   . Cesarean section      SOCIAL HISTORY:   Social History  Substance Use Topics  . Smoking status: Never Smoker   . Smokeless tobacco: Never Used  . Alcohol Use: Yes    FAMILY HISTORY:   Family History  Problem Relation Age of Onset  . Hypertension Mother     DRUG  ALLERGIES:   Allergies  Allergen Reactions  . Allegra [Fexofenadine] Other (See Comments)    cough  . Claritin [Loratadine] Palpitations    REVIEW OF SYSTEMS:   Review of Systems  Constitutional: Positive for malaise/fatigue. Negative for fever, chills and weight loss.  HENT: Negative for ear discharge, ear pain, hearing loss, nosebleeds and tinnitus.   Eyes: Negative for blurred vision, double vision and photophobia.  Respiratory: Positive for shortness of breath. Negative for cough, hemoptysis and wheezing.   Cardiovascular: Positive for palpitations. Negative for chest pain, orthopnea and leg swelling.  Gastrointestinal: Negative for heartburn, nausea, vomiting, abdominal pain, diarrhea, constipation and melena.  Genitourinary: Negative for dysuria, urgency, frequency and hematuria.  Musculoskeletal: Positive for back pain. Negative for myalgias and neck pain.  Skin: Negative for rash.  Neurological: Positive for weakness. Negative for dizziness, tingling, tremors, sensory change, speech change, focal weakness and headaches.  Endo/Heme/Allergies: Does not bruise/bleed easily.  Psychiatric/Behavioral: Negative for depression.    MEDICATIONS AT HOME:   Prior to Admission medications   Medication Sig Start Date End Date Taking? Authorizing Provider  amLODipine (NORVASC) 10 MG tablet Take 10 mg by mouth daily.   Yes Historical Provider, MD  lisinopril (PRINIVIL,ZESTRIL) 10 MG tablet Take 10 mg by mouth daily.   Yes Historical Provider, MD  potassium chloride SA (K-DUR,KLOR-CON) 20 MEQ tablet Take  20 mEq by mouth daily.   Yes Historical Provider, MD  ondansetron (ZOFRAN ODT) 4 MG disintegrating tablet Take 1 tablet (4 mg total) by mouth every 8 (eight) hours as needed for nausea or vomiting. Patient not taking: Reported on 06/12/2015 03/03/15   Darci Currentandolph N Brown, MD      VITAL SIGNS:  Blood pressure 129/90, pulse 96, temperature 97.5 F (36.4 C), temperature source Oral, resp.  rate 20, height 5\' 2"  (1.575 m), weight 142.883 kg (315 lb), SpO2 100 %.  PHYSICAL EXAMINATION:   Physical Exam  GENERAL:  54 y.o.-year-old obese patient lying in the bed with no acute distress.  EYES: Pupils equal, round, reactive to light and accommodation. No scleral icterus. Extraocular muscles intact.  HEENT: Head atraumatic, normocephalic. Oropharynx and nasopharynx clear.  NECK:  Supple, no jugular venous distention. No thyroid enlargement, no tenderness.  LUNGS: Normal breath sounds bilaterally, no wheezing, rales,rhonchi or crepitation. No use of accessory muscles of respiration. Decreased bibasilar breath sounds CARDIOVASCULAR: S1, S2 normal. No murmurs, rubs, or gallops.  ABDOMEN: Soft, nontender, nondistended. Bowel sounds present. No organomegaly or mass.  EXTREMITIES: No pedal edema, cyanosis, or clubbing.  NEUROLOGIC: Cranial nerves II through XII are intact. Muscle strength 5/5 in all extremities. Sensation intact. Gait not checked.  PSYCHIATRIC: The patient is alert and oriented x 3.  SKIN: No obvious rash, lesion, or ulcer.   LABORATORY PANEL:   CBC  Recent Labs Lab 06/12/15 1019  WBC 6.8  HGB 13.9  HCT 42.8  PLT 235   ------------------------------------------------------------------------------------------------------------------  Chemistries   Recent Labs Lab 06/12/15 1155  NA 142  K 3.5  CL 108  CO2 26  GLUCOSE 109*  BUN 14  CREATININE 0.72  CALCIUM 9.0   ------------------------------------------------------------------------------------------------------------------  Cardiac Enzymes  Recent Labs Lab 06/12/15 1155  TROPONINI <0.03   ------------------------------------------------------------------------------------------------------------------  RADIOLOGY:  Dg Chest 2 View  06/12/2015  CLINICAL DATA:  Shortness of breath beginning this morning. History of asthma. EXAM: CHEST  2 VIEW COMPARISON:  None. FINDINGS: Heart is borderline in  size. Lungs are clear. No effusions. No acute bony abnormality. Degenerative spurring in the thoracic spine. IMPRESSION: No active cardiopulmonary disease. Electronically Signed   By: Charlett NoseKevin  Dover M.D.   On: 06/12/2015 11:02   Ct Angio Chest Pe W/cm &/or Wo Cm  06/12/2015  CLINICAL DATA:  New onset atrial fibrillation with shortness of Breath EXAM: CT ANGIOGRAPHY CHEST WITH CONTRAST TECHNIQUE: Multidetector CT imaging of the chest was performed using the standard protocol during bolus administration of intravenous contrast. Multiplanar CT image reconstructions and MIPs were obtained to evaluate the vascular anatomy. CONTRAST:  100mL OMNIPAQUE IOHEXOL 350 MG/ML SOLN COMPARISON:  None. FINDINGS: The lungs are well aerated bilaterally. Linear density is noted in the left lower lobe likely representing scarring. No focal confluent infiltrate or sizable effusion is noted. The thoracic inlet is within normal limits. The thoracic aorta shows no aneurysmal dilatation or dissection. The pulmonary artery shows a normal branching pattern. No intraluminal filling defect to suggest pulmonary embolism is identified. The left atrium is mildly prominent although no filling defects to suggest atrial thrombi are noted. No hilar or mediastinal adenopathy is seen. Scanning into the upper abdomen reveals no acute abnormality. The osseous structures show degenerative change of the thoracic spine. Review of the MIP images confirms the above findings. IMPRESSION: No evidence of pulmonary emboli. Likely left lower lobe scarring. No other focal abnormality is seen. Electronically Signed   By: Eulah PontMark  Lukens M.D.  On: 06/12/2015 15:29    EKG:   Orders placed or performed during the hospital encounter of 06/12/15  . ED EKG  . ED EKG  . EKG 12-Lead  . EKG 12-Lead    IMPRESSION AND PLAN:   Chevelle Coulson  is a 53 y.o. female with a known history of obesity, sleep apnea, hypertension, asthma, history of irregular heartbeat in  the past presents to the hospital secondary to extreme weakness and palpitations since morning.  #1 paroxysmal atrial fibrillation-likely triggered by her respiratory issues and OSA -Rate controlled now. Cardiology consult. Metoprolol when necessary. -Echocardiogram. Started on eliquis for anticoagulation.  #2 obstructive sleep apnea-continue CPAP at bedtime.   #3 hypertension-continue Norvasc and lisinopril for now  #4 asthma-no acute exacerbation. Stable.  #5 DVT prophylaxis-on eliquis now    All the records are reviewed and case discussed with ED provider. Management plans discussed with the patient, family and they are in agreement.  CODE STATUS: Full code  TOTAL TIME TAKING CARE OF THIS PATIENT: 50 minutes.    Enid Baas M.D on 06/12/2015 at 4:33 PM  Between 7am to 6pm - Pager - [phone]  After 6pm go to [email or web address] - password [email or web address] [facility]  [facility]  Office  [phone]  CC: Primary care physician; Sandrea Hughs, NP

## 2015-06-12 NOTE — ED Notes (Signed)
Report given to Nellie, RN.  

## 2015-06-13 ENCOUNTER — Encounter: Payer: Self-pay | Admitting: Physician Assistant

## 2015-06-13 DIAGNOSIS — I5030 Unspecified diastolic (congestive) heart failure: Secondary | ICD-10-CM | POA: Diagnosis not present

## 2015-06-13 DIAGNOSIS — I4891 Unspecified atrial fibrillation: Secondary | ICD-10-CM | POA: Diagnosis present

## 2015-06-13 DIAGNOSIS — G4733 Obstructive sleep apnea (adult) (pediatric): Secondary | ICD-10-CM | POA: Diagnosis present

## 2015-06-13 DIAGNOSIS — I480 Paroxysmal atrial fibrillation: Secondary | ICD-10-CM | POA: Diagnosis present

## 2015-06-13 DIAGNOSIS — I10 Essential (primary) hypertension: Secondary | ICD-10-CM | POA: Diagnosis present

## 2015-06-13 DIAGNOSIS — J45909 Unspecified asthma, uncomplicated: Secondary | ICD-10-CM | POA: Diagnosis present

## 2015-06-13 DIAGNOSIS — Z6841 Body Mass Index (BMI) 40.0 and over, adult: Secondary | ICD-10-CM | POA: Diagnosis not present

## 2015-06-13 DIAGNOSIS — Z888 Allergy status to other drugs, medicaments and biological substances status: Secondary | ICD-10-CM | POA: Diagnosis not present

## 2015-06-13 DIAGNOSIS — Z79899 Other long term (current) drug therapy: Secondary | ICD-10-CM | POA: Diagnosis not present

## 2015-06-13 DIAGNOSIS — I340 Nonrheumatic mitral (valve) insufficiency: Secondary | ICD-10-CM | POA: Diagnosis not present

## 2015-06-13 DIAGNOSIS — E876 Hypokalemia: Secondary | ICD-10-CM | POA: Diagnosis present

## 2015-06-13 DIAGNOSIS — R0600 Dyspnea, unspecified: Secondary | ICD-10-CM | POA: Diagnosis present

## 2015-06-13 LAB — BASIC METABOLIC PANEL
Anion gap: 6 (ref 5–15)
BUN: 15 mg/dL (ref 6–20)
CALCIUM: 8.9 mg/dL (ref 8.9–10.3)
CHLORIDE: 103 mmol/L (ref 101–111)
CO2: 30 mmol/L (ref 22–32)
CREATININE: 0.83 mg/dL (ref 0.44–1.00)
GFR calc non Af Amer: >60 mL/min (ref >60)
Glucose, Bld: 117 mg/dL — ABNORMAL HIGH (ref 65–99)
Potassium: 3.1 mmol/L — ABNORMAL LOW (ref 3.5–5.1)
SODIUM: 139 mmol/L (ref 135–145)

## 2015-06-13 LAB — CBC
HCT: 39.3 % (ref 35.0–47.0)
Hemoglobin: 12.5 g/dL (ref 12.0–16.0)
MCH: 26.4 pg (ref 26.0–34.0)
MCHC: 31.9 g/dL — ABNORMAL LOW (ref 32.0–36.0)
MCV: 82.8 fL (ref 80.0–100.0)
PLATELETS: 232 10*3/uL (ref 150–440)
RBC: 4.74 MIL/uL (ref 3.80–5.20)
RDW: 16.1 % — AB (ref 11.5–14.5)
WBC: 7.3 10*3/uL (ref 3.6–11.0)

## 2015-06-13 MED ORDER — BISACODYL 5 MG PO TBEC
10.0000 mg | DELAYED_RELEASE_TABLET | Freq: Every day | ORAL | Status: DC | PRN
  Filled 2015-06-13: qty 2

## 2015-06-13 MED ORDER — DILTIAZEM HCL 60 MG PO TABS
60.0000 mg | ORAL_TABLET | Freq: Three times a day (TID) | ORAL | Status: DC
  Administered 2015-06-13 – 2015-06-14 (×2): 60 mg via ORAL
  Filled 2015-06-13 (×2): qty 1

## 2015-06-13 MED ORDER — DILTIAZEM HCL 30 MG PO TABS
30.0000 mg | ORAL_TABLET | Freq: Four times a day (QID) | ORAL | Status: DC
  Administered 2015-06-13: 30 mg via ORAL
  Filled 2015-06-13: qty 1

## 2015-06-13 MED ORDER — POTASSIUM CHLORIDE CRYS ER 20 MEQ PO TBCR
40.0000 meq | EXTENDED_RELEASE_TABLET | ORAL | Status: AC
Start: 2015-06-13 — End: 2015-06-13
  Administered 2015-06-13 (×2): 40 meq via ORAL
  Filled 2015-06-13 (×3): qty 2

## 2015-06-13 MED ORDER — PSYLLIUM 95 % PO PACK
1.0000 | PACK | Freq: Every day | ORAL | Status: DC
  Administered 2015-06-13 – 2015-06-14 (×2): 1 via ORAL
  Filled 2015-06-13 (×2): qty 1

## 2015-06-13 MED ORDER — FUROSEMIDE 10 MG/ML IJ SOLN
40.0000 mg | Freq: Once | INTRAMUSCULAR | Status: AC
  Administered 2015-06-13: 40 mg via INTRAVENOUS
  Filled 2015-06-13: qty 4

## 2015-06-13 MED ORDER — METOPROLOL TARTRATE 50 MG PO TABS
50.0000 mg | ORAL_TABLET | Freq: Two times a day (BID) | ORAL | Status: DC
  Administered 2015-06-13 – 2015-06-14 (×3): 50 mg via ORAL
  Filled 2015-06-13 (×3): qty 1

## 2015-06-13 MED ORDER — FUROSEMIDE 10 MG/ML IJ SOLN
40.0000 mg | Freq: Two times a day (BID) | INTRAMUSCULAR | Status: DC
  Administered 2015-06-13 – 2015-06-14 (×2): 40 mg via INTRAVENOUS
  Filled 2015-06-13 (×2): qty 4

## 2015-06-13 MED ORDER — OXYCODONE-ACETAMINOPHEN 5-325 MG PO TABS: 2.0000 | ORAL_TABLET | Freq: Four times a day (QID) | ORAL | Status: DC | PRN

## 2015-06-13 NOTE — Care Management (Signed)
Admitted with new onset atrial fibrillation.  Provided patient with coupons for 30 day free and copay assist coupons

## 2015-06-13 NOTE — Progress Notes (Signed)
Patient alert and oriented x4, no complaints at this time. vss at this time. Patient afib on telemetry. Will continue to assess. Molly Rivera   

## 2015-06-13 NOTE — Consult Note (Signed)
Cardiology Consultation Note  Patient ID: Molly Rivera, MRN: [id number], DOB/AGE: 1960/08/15 54 y.o. Admit date: 06/12/2015   Date of Consult: 06/13/2015 Primary Physician: Sandrea Hughs, NP Primary Cardiologist: New to [facility]  Chief Complaint: SOB Reason for Consult: New onset Afib  HPI: 54 y.o. female with h/o irregular heart beats originally diagnosed by PCP previously on beta blocker though not on long term full dose anticoagulation upon admission, HTN, OSA on CPAP, asthma, allergies, morbid obesity, recently diagnosed diverticulitis, and arthritis who presented to [facility] on 11/21 with increased weakness and palpitations and was found to be in new onset Afib with RVR with heart rates in the 130's. Cardiology was consulted for further input.   At baseline she is a very active person, working two jobs as a Psychologist, clinical and a Lawyer at [employer]. She has previously been able to go a "great distance" with ambulation prior to developing any shortness of breath and has been able to ambulate up stairs without issues. Her prior diagnosis of an irregular heart beat never limited her life. She was previously placed on metoprolol, but this was previously discontinued in the setting of leg cramps. Interestingly, she also was previously on HCTZ but was found to be hypokalemic and this was discontinued and she was started on potassium supplementation. However, her hypokalemia persisted even in the setting of potassium supplementation.   On the evening of 11/20 she went to bed feeling fine. Upon waking up for for her home home job she noticed increased palpitations and SOB. She continued to get ready for work and went to her clients house. She was able to give him a bath, but her palpitations worsened along with her SOB causing her to have to leave early and come to the ED. She also noticed some chest heaviness with associated leg swelling. There was no associated nausea, vomiting, diaphoresis, presyncope, or  syncope. She has never had any palpitations similar to this before.    Upon the patient's arrival to [facility] they were found to have negative troponin x 1, K+ 3.5-->3.1, BNP 145, normal TSH. ECG showed Afib with RVR, 134 bpm, low voltage precordial leads, repolarization abnormality, no significant st/t changes, CXR showed no acute cardiopulmonary disease. CTA chest was negative for PE with likely left lower lobe scarring, otherwise no other focal abnormality was seen. She was rate controlled with IV Cardizem and prn metoprolol. She was started on Eliquis by IM. Currently with palpitations with resting heart rate in the low 100's. With minimal ambulation in the room this morning her heart rate has gotten into the 170's to 180's. She also notes some SOB with lower extremity edema.        Past Medical History  Diagnosis Date  . Allergy   . Arthritis   . Hypertension   . Sleep apnea   . Asthma   . A-fib (HCC)     a. new onset 05/2015, b. CHADS2VASc => 2 (HTN and sex category)  . Morbid obesity (HCC)   . Diverticulitis       Most Recent Cardiac Studies: Echo pending   Surgical History:  Past Surgical History  Procedure Laterality Date  . Knee surgery    . Abdominal hysterectomy    . Heel spur excision Left   . Cesarean section       Home Meds: Prior to Admission medications   Medication Sig Start Date End Date Taking? Authorizing Provider  amLODipine (NORVASC) 10 MG tablet Take 10 mg by  mouth daily.   Yes Historical Provider, MD  lisinopril (PRINIVIL,ZESTRIL) 10 MG tablet Take 10 mg by mouth daily.   Yes Historical Provider, MD  potassium chloride SA (K-DUR,KLOR-CON) 20 MEQ tablet Take 20 mEq by mouth daily.   Yes Historical Provider, MD  ondansetron (ZOFRAN ODT) 4 MG disintegrating tablet Take 1 tablet (4 mg total) by mouth every 8 (eight) hours as needed for nausea or vomiting. Patient not taking: Reported on 06/12/2015 03/03/15   Darci Currentandolph N Brown, MD    Inpatient Medications:  .  apixaban  5 mg Oral BID  . lisinopril  10 mg Oral Daily  . metoprolol tartrate  50 mg Oral BID  . potassium chloride  40 mEq Oral Q4H      Allergies:  Allergies  Allergen Reactions  . Allegra [Fexofenadine] Other (See Comments)    cough  . Morphine And Related Nausea Only  . Claritin [Loratadine] Palpitations    Social History   Social History  . Marital Status: Single    Spouse Name: N/A  . Number of Children: N/A  . Years of Education: N/A   Occupational History  . Not on file.   Social History Main Topics  . Smoking status: Never Smoker   . Smokeless tobacco: Never Used  . Alcohol Use: 0.0 oz/week    0 Standard drinks or equivalent per week     Comment: occasional beer  . Drug Use: No  . Sexual Activity: Yes    Birth Control/ Protection: Surgical   Other Topics Concern  . Not on file   Social History Narrative   ** Merged History Encounter **         Family History  Problem Relation Age of Onset  . Adopted: Yes  . Hypertension Mother   . Diabetes Mother     possible  . Schizophrenia Mother   . Healthy Son      Review of Systems: Review of Systems  Constitutional: Positive for malaise/fatigue. Negative for fever, chills, weight loss and diaphoresis.  HENT: Negative for congestion and sore throat.   Eyes: Negative for discharge and redness.  Respiratory: Positive for shortness of breath. Negative for cough, hemoptysis, sputum production and wheezing.   Cardiovascular: Positive for palpitations and leg swelling. Negative for chest pain, orthopnea, claudication and PND.  Gastrointestinal: Negative for heartburn, nausea, vomiting, abdominal pain, diarrhea, constipation, blood in stool and melena.  Musculoskeletal: Positive for myalgias and back pain. Negative for joint pain, falls and neck pain.       Right-sided back pain  Skin: Negative for rash.  Neurological: Positive for weakness. Negative for dizziness, tingling, tremors, sensory change, speech  change, focal weakness, loss of consciousness and headaches.  Endo/Heme/Allergies: Does not bruise/bleed easily.  Psychiatric/Behavioral: Positive for substance abuse. Negative for memory loss. The patient is not nervous/anxious.        Occasional ETOH   All other systems reviewed and are negative.   Labs:  Recent Labs  06/12/15 1155  TROPONINI <0.03   Lab Results  Component Value Date   WBC 7.3 06/13/2015   HGB 12.5 06/13/2015   HCT 39.3 06/13/2015   MCV 82.8 06/13/2015   PLT 232 06/13/2015     Recent Labs Lab 06/13/15 0418  NA 139  K 3.1*  CL 103  CO2 30  BUN 15  CREATININE 0.83  CALCIUM 8.9  GLUCOSE 117*   No results found for: CHOL, HDL, LDLCALC, TRIG No results found for: DDIMER  Radiology/Studies:  Dg  Chest 2 View  06/12/2015  CLINICAL DATA:  Shortness of breath beginning this morning. History of asthma. EXAM: CHEST  2 VIEW COMPARISON:  None. FINDINGS: Heart is borderline in size. Lungs are clear. No effusions. No acute bony abnormality. Degenerative spurring in the thoracic spine. IMPRESSION: No active cardiopulmonary disease. Electronically Signed   By: Charlett Nose M.D.   On: 06/12/2015 11:02   Ct Angio Chest Pe W/cm &/or Wo Cm  06/12/2015  CLINICAL DATA:  New onset atrial fibrillation with shortness of Breath EXAM: CT ANGIOGRAPHY CHEST WITH CONTRAST TECHNIQUE: Multidetector CT imaging of the chest was performed using the standard protocol during bolus administration of intravenous contrast. Multiplanar CT image reconstructions and MIPs were obtained to evaluate the vascular anatomy. CONTRAST:  OMNIPAQUE IOHEXOL 350 MG/ML SOLN COMPARISON:  None. FINDINGS: The lungs are well aerated bilaterally. Linear density is noted in the left lower lobe likely representing scarring. No focal confluent infiltrate or sizable effusion is noted. The thoracic inlet is within normal limits. The thoracic aorta shows no aneurysmal dilatation or dissection. The pulmonary artery  shows a normal branching pattern. No intraluminal filling defect to suggest pulmonary embolism is identified. The left atrium is mildly prominent although no filling defects to suggest atrial thrombi are noted. No hilar or mediastinal adenopathy is seen. Scanning into the upper abdomen reveals no acute abnormality. The osseous structures show degenerative change of the thoracic spine. Review of the MIP images confirms the above findings. IMPRESSION: No evidence of pulmonary emboli. Likely left lower lobe scarring. No other focal abnormality is seen. Electronically Signed   By: Alcide Clever M.D.   On: 06/12/2015 15:29    EKG: Afib with RVR, 134 bpm, low voltage precordial leads, repolarization abnormality, no significant st/t changes   Weights: Filed Weights   06/12/15 0946 06/12/15 1716  Weight: 315 lb (142.883 kg) 313 lb 9.6 oz (142.248 kg)     Physical Exam: Blood pressure 125/77, pulse 93, temperature 97.6 F (36.4 C), temperature source Oral, resp. rate 19, height  (1.575 m), weight 313 lb 9.6 oz (142.248 kg), SpO2 99 %. Body mass index is 57.34 kg/(m^2). General: Well developed, well nourished, in no acute distress. Head: Normocephalic, atraumatic, sclera non-icteric, no xanthomas, nares are without discharge.  Neck: Negative for carotid bruits. JVD not elevated. Lungs: Clear bilaterally to auscultation without wheezes, rales, or rhonchi. Breathing is unlabored. Heart: Irregularly-irregular, with S1 S2. No murmurs, rubs, or gallops appreciated. Abdomen: Morbidly obese, soft, non-tender, non-distended with normoactive bowel sounds. No hepatomegaly. No rebound/guarding. No obvious abdominal masses. Msk:  Strength and tone appear normal for age. Right-sided posterior chest TTP.  Extremities: No clubbing or cyanosis. Trace pre-tibial edema.  Distal pedal pulses are 2+ and equal bilaterally. Neuro: Alert and oriented X 3. No facial asymmetry. No focal deficit. Moves all extremities  spontaneously. Psych:  Responds to questions appropriately with a normal affect.    Assessment and Plan:   1. New onset Afib with RVR: -Currently with heart rate in the 130's with minimal movement while eating breakfast, symptomatic  -Add diltiazem 30 mg 6 hours hours with hold parameters  -Continue metoprolol 50 mg bid (consider her prior history of leg cramps associated with her history of hypokalemia as this may be unusual to have with beta blocker), may need to change dosing to 25 mg q 6 hours if she remains uncontrolled -Would aim for cardioversion sooner rather than later given her symptomatic nature. Dr. Graciela Husbands is discussing the schedule with  Dr. Mariah Milling this morning for possible DCCV 11/23 -Add low dose Lasix 20 mg x 1 -On Eliquis 5 mg bid, continue -CHADS2VASc at least 2 (HTN and sex category) giving her an estimated annual stroke risk of 2.2% -Check echo to evaluate LV systolic function, wall motion, left atrial size, and right-sided pressure  2. HTN with associated hypokalemia: -Controlled -Check renin-angiotension-aldosterone level for hyperaldosteronism  -On Lopressor 50 mg bid as above -Diltiazem 30 mg q 6 hours added as above  3. Sleep apnea: -On CPAP -Likely playing a role in #1  4. Asthma/allergies: -Controlled -Per IM  5. Morbid obesity: -Weight loss advised -Likely playing a role in #1  6. Hypokalemia: -Being repleted to goal of 4.0  7. Chest pain: -Negative troponin in the setting of Afib with RVR -Will schedule outpatient Myoview    Signed, Eula Listen, PA-C Pager: [phone] 06/13/2015, 8:48 AM  The patient has persistent atrial fibrillation. Rates are really quite rapid. Cardioversion I think is appropriate. I would be in favor of proceeding with TEE not withstanding the fact that she has been anticoagulated since yesterday.  The frequency of recurrence would dictate the need for antiarrhythmic therapy.  Her CHADS-VASc score such that  long-term anticoagulation is appropriate.  Her blood pressures elevated and she has had a problem with recurrent hypokalemia; this prompts  the need to exclude hyper aldosteronism.  Her chest pain syndrome is concerning although she certainly has a component of HFpEF (presuming normal EF) which may further be contributing. Myoview scanning as an outpatient is appropriate  she is volume overloaded. We'll use IV diuretics

## 2015-06-13 NOTE — Progress Notes (Signed)
[facility] [place] [facility]   PATIENT NAME: Molly Bowieatrica Raetz    MR#:  [id number]  DATE OF BIRTH:  Oct 14, 1960  SUBJECTIVE:  CHIEF COMPLAINT:   Chief Complaint  Patient presents with  . Shortness of Breath   Still feels weak and has palpitations. Shortness of breath improved  REVIEW OF SYSTEMS:    Review of Systems  Constitutional: Positive for malaise/fatigue. Negative for fever and chills.  HENT: Negative for sore throat.   Eyes: Negative for blurred vision, double vision and pain.  Respiratory: Negative for cough, hemoptysis, shortness of breath and wheezing.   Cardiovascular: Positive for palpitations. Negative for chest pain, orthopnea and leg swelling.  Gastrointestinal: Positive for nausea. Negative for heartburn, vomiting, abdominal pain, diarrhea and constipation.  Genitourinary: Negative for dysuria and hematuria.  Musculoskeletal: Negative for back pain and joint pain.  Skin: Negative for rash.  Neurological: Positive for weakness and headaches. Negative for sensory change, speech change and focal weakness.  Endo/Heme/Allergies: Does not bruise/bleed easily.  Psychiatric/Behavioral: Negative for depression. The patient is not nervous/anxious.       DRUG ALLERGIES:   Allergies  Allergen Reactions  . Allegra [Fexofenadine] Other (See Comments)    cough  . Morphine And Related Nausea Only  . Claritin [Loratadine] Palpitations    VITALS:  Blood pressure 131/84, pulse 92, temperature 97.8 F (36.6 C), temperature source Oral, resp. rate 18, height 5\' 2"  (1.575 m), weight 142.248 kg (313 lb 9.6 oz), SpO2 94 %.  PHYSICAL EXAMINATION:   Physical Exam  GENERAL:  54 y.o.-year-old patient lying in the bed with no acute distress. Morbidly obese. EYES: Pupils equal, round, reactive to light and accommodation. No scleral icterus. Extraocular muscles intact.  HEENT: Head atraumatic, normocephalic. Oropharynx and nasopharynx clear.   NECK:  Supple, no jugular venous distention. No thyroid enlargement, no tenderness.  LUNGS: Normal breath sounds bilaterally, no wheezing, rales, rhonchi. No use of accessory muscles of respiration.  CARDIOVASCULAR: S1, S2 Irregularly irregular. No murmurs, rubs, or gallops.  ABDOMEN: Soft, nontender, nondistended. Bowel sounds present. No organomegaly or mass.  EXTREMITIES: No cyanosis, clubbing or edema b/l.    NEUROLOGIC: Cranial nerves II through XII are intact. No focal Motor or sensory deficits b/l.   PSYCHIATRIC: The patient is alert and oriented x 3.  SKIN: No obvious rash, lesion, or ulcer.    LABORATORY PANEL:   CBC  Recent Labs Lab 06/13/15 0418  WBC 7.3  HGB 12.5  HCT 39.3  PLT 232   ------------------------------------------------------------------------------------------------------------------  Chemistries   Recent Labs Lab 06/13/15 0418  NA 139  K 3.1*  CL 103  CO2 30  GLUCOSE 117*  BUN 15  CREATININE 0.83  CALCIUM 8.9   ------------------------------------------------------------------------------------------------------------------  Cardiac Enzymes  Recent Labs Lab 06/12/15 1155  TROPONINI <0.03   ------------------------------------------------------------------------------------------------------------------  RADIOLOGY:  Dg Chest 2 View  06/12/2015  CLINICAL DATA:  Shortness of breath beginning this morning. History of asthma. EXAM: CHEST  2 VIEW COMPARISON:  None. FINDINGS: Heart is borderline in size. Lungs are clear. No effusions. No acute bony abnormality. Degenerative spurring in the thoracic spine. IMPRESSION: No active cardiopulmonary disease. Electronically Signed   By: Charlett NoseKevin  Dover M.D.   On: 06/12/2015 11:02   Ct Angio Chest Pe W/cm &/or Wo Cm  06/12/2015  CLINICAL DATA:  New onset atrial fibrillation with shortness of Breath EXAM: CT ANGIOGRAPHY CHEST WITH CONTRAST TECHNIQUE: Multidetector CT imaging of the chest was performed  using the standard protocol during  bolus administration of intravenous contrast. Multiplanar CT image reconstructions and MIPs were obtained to evaluate the vascular anatomy. CONTRAST:  OMNIPAQUE IOHEXOL 350 MG/ML SOLN COMPARISON:  None. FINDINGS: The lungs are well aerated bilaterally. Linear density is noted in the left lower lobe likely representing scarring. No focal confluent infiltrate or sizable effusion is noted. The thoracic inlet is within normal limits. The thoracic aorta shows no aneurysmal dilatation or dissection. The pulmonary artery shows a normal branching pattern. No intraluminal filling defect to suggest pulmonary embolism is identified. The left atrium is mildly prominent although no filling defects to suggest atrial thrombi are noted. No hilar or mediastinal adenopathy is seen. Scanning into the upper abdomen reveals no acute abnormality. The osseous structures show degenerative change of the thoracic spine. Review of the MIP images confirms the above findings. IMPRESSION: No evidence of pulmonary emboli. Likely left lower lobe scarring. No other focal abnormality is seen. Electronically Signed   By: Alcide Clever M.D.   On: 06/12/2015 15:29     ASSESSMENT AND PLAN:   Molly Rivera is a 54 y.o. female with a known history of obesity, sleep apnea, hypertension, asthma, history of irregular heartbeat in the past presents to the hospital secondary to extreme weakness and palpitations since morning.  # Paroxysmal atrial fibrillation with rapid ventricular rate-likely triggered by her respiratory issues and OSA. - HR still in 130s. I added Lopressor 50 BID today morning. Cardiology has added cardizem  Q 6hrs. Cardioversion if pt doesn't spontaneously convert to NSR -Echocardiogram. Started on eliquis for anticoagulation.  # HTN Hold Norvasc as we have started BB and CCB  # obstructive sleep apnea-continue CPAP at bedtime.   # hypertension-continue Norvasc and  lisinopril for now  # asthma-no acute exacerbation. Stable.  # DVT prophylaxis-on eliquis now   All the records are reviewed and case discussed with Care Management/Social Workerr. Management plans discussed with the patient, family and they are in agreement.  CODE STATUS: FULL  DVT Prophylaxis: SCDs  TOTAL TIME TAKING CARE OF THIS PATIENT: 35 minutes.   POSSIBLE D/C IN 1-2 DAYS, DEPENDING ON CLINICAL CONDITION.   Milagros Loll R M.D on 06/13/2015 at 12:44 PM  Between 7am to 6pm - Pager - [phone]  After 6pm go to [email or web address] - password [email or web address] [facility]  [facility] [place] [facility]  Office  [phone]  CC: Primary care physician; Sandrea Hughs, NP    Note: This dictation was prepared with Dragon dictation along with smaller phrase technology. Any transcriptional errors that result from this process are unintentional.

## 2015-06-13 NOTE — Progress Notes (Signed)
Patient had 1.31 pause earlier today, Eula Listenyan Dunn, PA notified. No new orders at this time.  Trudee KusterBrandi R [place]

## 2015-06-14 ENCOUNTER — Inpatient Hospital Stay: Payer: No Typology Code available for payment source | Admitting: Anesthesiology

## 2015-06-14 ENCOUNTER — Encounter
Admission: EM | Disposition: A | Discharge status: Home / Self Care | Payer: Self-pay | Source: Home / Self Care | Attending: Internal Medicine

## 2015-06-14 ENCOUNTER — Encounter: Payer: Self-pay | Admitting: Physician Assistant

## 2015-06-14 ENCOUNTER — Inpatient Hospital Stay
Admit: 2015-06-14 | Discharge: 2015-06-14 | Disposition: A | Discharge status: Home / Self Care | Payer: No Typology Code available for payment source | Attending: Cardiovascular Disease | Admitting: Cardiovascular Disease

## 2015-06-14 DIAGNOSIS — I10 Essential (primary) hypertension: Secondary | ICD-10-CM

## 2015-06-14 DIAGNOSIS — I4891 Unspecified atrial fibrillation: Secondary | ICD-10-CM

## 2015-06-14 DIAGNOSIS — E669 Obesity, unspecified: Secondary | ICD-10-CM

## 2015-06-14 DIAGNOSIS — J45909 Unspecified asthma, uncomplicated: Secondary | ICD-10-CM

## 2015-06-14 DIAGNOSIS — G4733 Obstructive sleep apnea (adult) (pediatric): Secondary | ICD-10-CM

## 2015-06-14 DIAGNOSIS — E66813 Obesity, class 3: Secondary | ICD-10-CM

## 2015-06-14 DIAGNOSIS — I340 Nonrheumatic mitral (valve) insufficiency: Secondary | ICD-10-CM

## 2015-06-14 HISTORY — PX: TEE WITHOUT CARDIOVERSION: SHX5443

## 2015-06-14 HISTORY — PX: ELECTROPHYSIOLOGIC STUDY: SHX172A

## 2015-06-14 LAB — BASIC METABOLIC PANEL
Anion gap: 7 (ref 5–15)
BUN: 20 mg/dL (ref 6–20)
CHLORIDE: 106 mmol/L (ref 101–111)
CO2: 29 mmol/L (ref 22–32)
CREATININE: 0.92 mg/dL (ref 0.44–1.00)
Calcium: 8.9 mg/dL (ref 8.9–10.3)
GFR calc Af Amer: >60 mL/min (ref >60)
GFR calc non Af Amer: >60 mL/min (ref >60)
Glucose, Bld: 124 mg/dL — ABNORMAL HIGH (ref 65–99)
Potassium: 3.6 mmol/L (ref 3.5–5.1)
Sodium: 142 mmol/L (ref 135–145)

## 2015-06-14 SURGERY — ECHOCARDIOGRAM, TRANSESOPHAGEAL
Anesthesia: General

## 2015-06-14 SURGERY — ECHOCARDIOGRAM, TRANSESOPHAGEAL
Anesthesia: Moderate Sedation

## 2015-06-14 MED ORDER — APIXABAN 5 MG PO TABS: 5.0000 mg | ORAL_TABLET | Freq: Two times a day (BID) | ORAL | Status: AC

## 2015-06-14 MED ORDER — POTASSIUM CHLORIDE CRYS ER 20 MEQ PO TBCR
20.0000 meq | EXTENDED_RELEASE_TABLET | Freq: Every day | ORAL | Status: DC
  Administered 2015-06-14: 20 meq via ORAL
  Filled 2015-06-14: qty 1

## 2015-06-14 MED ORDER — ACETAMINOPHEN 325 MG PO TABS
650.0000 mg | ORAL_TABLET | Freq: Once | ORAL | Status: AC
  Administered 2015-06-14: 650 mg via ORAL

## 2015-06-14 MED ORDER — SODIUM CHLORIDE 0.9 % IJ SOLN
3.0000 mL | INTRAMUSCULAR | Status: DC | PRN
Start: 2015-06-14 — End: 2015-06-14

## 2015-06-14 MED ORDER — ONDANSETRON HCL 4 MG/2ML IJ SOLN: 4.0000 mg | Freq: Once | INTRAMUSCULAR | Status: DC | PRN

## 2015-06-14 MED ORDER — FUROSEMIDE 10 MG/ML IJ SOLN: 40.0000 mg | Freq: Two times a day (BID) | INTRAMUSCULAR | Status: DC

## 2015-06-14 MED ORDER — BUTAMBEN-TETRACAINE-BENZOCAINE 2-2-14 % EX AERO
INHALATION_SPRAY | CUTANEOUS | Status: AC
  Filled 2015-06-14: qty 20

## 2015-06-14 MED ORDER — DILTIAZEM HCL ER COATED BEADS 120 MG PO CP24: 120.0000 mg | ORAL_CAPSULE | Freq: Every day | ORAL | Status: DC

## 2015-06-14 MED ORDER — METOPROLOL TARTRATE 50 MG PO TABS: 50.0000 mg | ORAL_TABLET | Freq: Two times a day (BID) | ORAL | Status: DC

## 2015-06-14 MED ORDER — PROPOFOL 10 MG/ML IV BOLUS
INTRAVENOUS | Status: DC | PRN
  Administered 2015-06-14: 30 mg via INTRAVENOUS

## 2015-06-14 MED ORDER — SODIUM CHLORIDE 0.9 % IV SOLN: 250.0000 mL | INTRAVENOUS | Status: DC

## 2015-06-14 MED ORDER — ACETAMINOPHEN 325 MG PO TABS
ORAL_TABLET | ORAL | Status: AC
  Administered 2015-06-14: 650 mg via ORAL
  Filled 2015-06-14: qty 2

## 2015-06-14 MED ORDER — MIDAZOLAM HCL 2 MG/2ML IJ SOLN
INTRAMUSCULAR | Status: DC | PRN
  Administered 2015-06-14 (×2): 1 mg via INTRAVENOUS

## 2015-06-14 MED ORDER — APIXABAN 5 MG PO TABS: 5.0000 mg | ORAL_TABLET | Freq: Two times a day (BID) | ORAL | Status: DC

## 2015-06-14 MED ORDER — LACTATED RINGERS IV SOLN
INTRAVENOUS | Status: DC | PRN
  Administered 2015-06-14: 13:00:00 via INTRAVENOUS

## 2015-06-14 MED ORDER — DILTIAZEM HCL 60 MG PO TABS: 60.0000 mg | ORAL_TABLET | Freq: Three times a day (TID) | ORAL | Status: DC

## 2015-06-14 MED ORDER — FENTANYL CITRATE (PF) 100 MCG/2ML IJ SOLN: 25.0000 ug | INTRAMUSCULAR | Status: DC | PRN

## 2015-06-14 MED ORDER — FENTANYL CITRATE (PF) 100 MCG/2ML IJ SOLN
INTRAMUSCULAR | Status: DC | PRN
Start: 2015-06-14 — End: 2015-06-14
  Administered 2015-06-14 (×2): 50 ug via INTRAVENOUS

## 2015-06-14 MED ORDER — SODIUM CHLORIDE 0.9 % IV SOLN: INTRAVENOUS | Status: DC

## 2015-06-14 MED ORDER — SODIUM CHLORIDE 0.9 % IJ SOLN
3.0000 mL | Freq: Two times a day (BID) | INTRAMUSCULAR | Status: DC
  Administered 2015-06-14: 3 mL via INTRAVENOUS

## 2015-06-14 NOTE — CV Procedure (Signed)
    Transesophageal Echocardiogram Note  Molly Rivera [id number] November 04, 1960  Procedure: Transesophageal Echocardiogram Indications: atrial fib  Procedure Details Consent: Obtained Time Out: Verified patient identification, verified procedure, site/side was marked, verified correct patient position, special equipment/implants available, Radiology Safety Procedures followed,  medications/allergies/relevent history reviewed, required imaging and test results available.  Performed  Medications: ( total for TEE and Cardioversion)  Fentanyl: 100 mg iv Versed: 2 mg iv Propofol 40 mg IV    Left Ventrical:  Normal LV function   Mitral Valve: moderate MR   Aortic Valve: normal AVf  Tricuspid Valve: trace TR   Pulmonic Valve: normal   Left Atrium/ Left atrial appendage: clear, no thrombi   Atrial septum: no PFO or ASD by color flow   Aorta: normal    Complications: No apparent complications Patient did tolerate procedure well.   \   Cardioversion Note  Molly Rivera [id number] November 04, 1960  Procedure: DC Cardioversion Indications: atrial fib   Procedure Details Consent: Obtained Time Out: Verified patient identification, verified procedure, site/side was marked, verified correct patient position, special equipment/implants available, Radiology Safety Procedures followed,  medications/allergies/relevent history reviewed, required imaging and test results available.  Performed  The patient has been on adequate anticoagulation.  The patient received IV Propofol ( see above )  for sedation.  Synchronous cardioversion was performed at 200 joules.  The cardioversion was successful     Complications: No apparent complications Patient did tolerate procedure well.   Vesta MixerPhilip J. Ethelyn Cerniglia, Montez HagemanJr., MD, [facility] 06/14/2015, 1:56 PM

## 2015-06-14 NOTE — Progress Notes (Signed)
       To Whom It May Concern      Ms. Molly Blalockatricia Strege was hospitalized at [facility] from the 06/12/2015 through 06/14/2015. She is to return back to work at full capacity on 06/19/2015. Thank you for your understanding.        Sincerely,     Katharina Caperima Sahory Nordling, MD

## 2015-06-14 NOTE — Transfer of Care (Signed)
Immediate Anesthesia Transfer of Care Note  Patient: Molly Rivera  Procedure(s) Performed: Procedure(s): TRANSESOPHAGEAL ECHOCARDIOGRAM (TEE) (N/A) CARDIOVERSION (N/A)  Patient Location: PACU, Cath Lab and cath lab  Anesthesia Type:MAC and General  Level of Consciousness: awake, alert  and oriented  Airway & Oxygen Therapy: Patient Spontanous Breathing and Patient connected to nasal cannula oxygen  Post-op Assessment: Report given to RN and Post -op Vital signs reviewed and stable  Post vital signs: Reviewed and stable  Last Vitals:  Filed Vitals:   06/14/15 1108 06/14/15 1230  BP: 128/92 130/83  Pulse: 72 87  Temp: 36.7 C 36.7 C  Resp: 18 14    Complications: No apparent anesthesia complications

## 2015-06-14 NOTE — Progress Notes (Signed)
[facility] -  at [facility]   PATIENT NAME: Molly Bowieatrica Older    MR#:  [id number]  DATE OF BIRTH:  May 11, 1961  SUBJECTIVE:  CHIEF COMPLAINT:   Chief Complaint  Patient presents with  . Shortness of Breath    Shortness of breath improved, no chest pains, heart rate is better controlled at 80s to 100s, but still remains in atrial fibrillation, TEE  as well as cardioversion is being planned by cardiologist today, Now on metoprolol, Cardizem, and Eliquis  REVIEW OF SYSTEMS:    Review of Systems  Constitutional: Positive for malaise/fatigue. Negative for fever and chills.  HENT: Negative for sore throat.   Eyes: Negative for blurred vision, double vision and pain.  Respiratory: Negative for cough, hemoptysis, shortness of breath and wheezing.   Cardiovascular: Positive for palpitations. Negative for chest pain, orthopnea and leg swelling.  Gastrointestinal: Positive for nausea. Negative for heartburn, vomiting, abdominal pain, diarrhea and constipation.  Genitourinary: Negative for dysuria and hematuria.  Musculoskeletal: Negative for back pain and joint pain.  Skin: Negative for rash.  Neurological: Positive for weakness and headaches. Negative for sensory change, speech change and focal weakness.  Endo/Heme/Allergies: Does not bruise/bleed easily.  Psychiatric/Behavioral: Negative for depression. The patient is not nervous/anxious.       DRUG ALLERGIES:   Allergies  Allergen Reactions  . Allegra [Fexofenadine] Other (See Comments)    cough  . Morphine And Related Nausea Only  . Claritin [Loratadine] Palpitations    VITALS:  Blood pressure 130/83, pulse 87, temperature 98.1 F (36.7 C), temperature source Oral, resp. rate 14, height 5\' 2"  (1.575 m), weight 142.248 kg (313 lb 9.6 oz), SpO2 99 %.  PHYSICAL EXAMINATION:   Physical Exam  GENERAL:  54 y.o.-year-old patient lying in the bed with no acute distress. Morbidly obese. EYES:  Pupils equal, round, reactive to light and accommodation. No scleral icterus. Extraocular muscles intact.  HEENT: Head atraumatic, normocephalic. Oropharynx and nasopharynx clear.  NECK:  Supple, no jugular venous distention. No thyroid enlargement, no tenderness.  LUNGS: Normal breath sounds bilaterally, no wheezing, rales, rhonchi. No use of accessory muscles of respiration.  CARDIOVASCULAR: S1, S2 Irregularly irregular. No murmurs, rubs, or gallops.  ABDOMEN: Soft, nontender, nondistended. Bowel sounds present. No organomegaly or mass.  EXTREMITIES: No cyanosis, clubbing or edema b/l.    NEUROLOGIC: Cranial nerves II through XII are intact. No focal Motor or sensory deficits b/l.   PSYCHIATRIC: The patient is alert and oriented x 3.  SKIN: No obvious rash, lesion, or ulcer.    LABORATORY PANEL:   CBC  Recent Labs Lab 06/13/15 0418  WBC 7.3  HGB 12.5  HCT 39.3  PLT 232   ------------------------------------------------------------------------------------------------------------------  Chemistries   Recent Labs Lab 06/14/15 0430  NA 142  K 3.6  CL 106  CO2 29  GLUCOSE 124*  BUN 20  CREATININE 0.92  CALCIUM 8.9   ------------------------------------------------------------------------------------------------------------------  Cardiac Enzymes  Recent Labs Lab 06/12/15 1155  TROPONINI <0.03   ------------------------------------------------------------------------------------------------------------------  RADIOLOGY:  Ct Angio Chest Pe W/cm &/or Wo Cm  06/12/2015  CLINICAL DATA:  New onset atrial fibrillation with shortness of Breath EXAM: CT ANGIOGRAPHY CHEST WITH CONTRAST TECHNIQUE: Multidetector CT imaging of the chest was performed using the standard protocol during bolus administration of intravenous contrast. Multiplanar CT image reconstructions and MIPs were obtained to evaluate the vascular anatomy. CONTRAST:  100mL OMNIPAQUE IOHEXOL 350 MG/ML SOLN  COMPARISON:  None. FINDINGS: The lungs are well aerated bilaterally. Linear density  is noted in the left lower lobe likely representing scarring. No focal confluent infiltrate or sizable effusion is noted. The thoracic inlet is within normal limits. The thoracic aorta shows no aneurysmal dilatation or dissection. The pulmonary artery shows a normal branching pattern. No intraluminal filling defect to suggest pulmonary embolism is identified. The left atrium is mildly prominent although no filling defects to suggest atrial thrombi are noted. No hilar or mediastinal adenopathy is seen. Scanning into the upper abdomen reveals no acute abnormality. The osseous structures show degenerative change of the thoracic spine. Review of the MIP images confirms the above findings. IMPRESSION: No evidence of pulmonary emboli. Likely left lower lobe scarring. No other focal abnormality is seen. Electronically Signed   By: Alcide Clever M.D.   On: 06/12/2015 15:29     ASSESSMENT AND PLAN:   Molly Rivera is a 54 y.o. female with a known history of obesity, sleep apnea, hypertension, asthma, history of irregular heartbeat in the past presents to the hospital secondary to extreme weakness and palpitations since morning.  # Paroxysmal atrial fibrillation with rapid ventricular rate-likely triggered by her respiratory issues and OSA. - H80-100 , but remains in atrial fibrillation , will be undergoing TEE and cardioversion by [facility] cardiology . now on Lopressor at  50 BID and Cardizem at  Q 8hrs. Cardioversion  today after TEE  -Continue  on eliquis for anticoagulation until recommended by cardiology   # HTN Hold Norvasc as we have started BB and CCB  # obstructive sleep apnea-continue CPAP at bedtime. Needs to have nocturnal oximetry study as outpatient to rule out significant hypoxemia episodes on current CPAP settings , could be done at home by CPAP provider versus sleep lab    # asthma-no acute  exacerbation. Stable.  # DVT prophylaxis-on eliquis now   All the records are reviewed and case discussed with Care Management/Social Workerr. Management plans discussed with the patient, family and they are in agreement.  CODE STATUS: FULL  DVT Prophylaxis: SCDs  TOTAL TIME TAKING CARE OF THIS PATIENT:  50 minutes   Coordination of care time 15 minutes. On discussions of outpatient evaluations   Sheralee Qazi M.D on 06/14/2015 at 1:02 PM  Between 7am to 6pm - Pager - [phone]  After 6pm go to [email or web address] - password [email or web address] [facility]  [place] [facility]  Office  [phone]  CC: Primary care physician; Sandrea Hughs, NP    Note: This dictation was prepared with Dragon dictation along with smaller phrase technology. Any transcriptional errors that result from this process are unintentional.

## 2015-06-14 NOTE — Anesthesia Preprocedure Evaluation (Addendum)
Anesthesia Evaluation  Patient identified by MRN, date of birth, ID band Patient awake    Reviewed: Allergy & Precautions, NPO status , Patient's Chart, lab work & pertinent test results, reviewed documented beta blocker date and time   Airway Mallampati: III  TM Distance: >3 FB     Dental  (+) Chipped   Pulmonary asthma , sleep apnea and Continuous Positive Airway Pressure Ventilation ,           Cardiovascular hypertension, Pt. on medications and Pt. on home beta blockers      Neuro/Psych    GI/Hepatic   Endo/Other    Renal/GU      Musculoskeletal  (+) Arthritis ,   Abdominal   Peds  Hematology   Anesthesia Other Findings Echo OK. A fib.Obesity.  May need intubation.   Reproductive/Obstetrics                           Anesthesia Physical Anesthesia Plan  ASA: III  Anesthesia Plan: General   Post-op Pain Management:    Induction: Intravenous  Airway Management Planned: Oral ETT and Mask  Additional Equipment:   Intra-op Plan:   Post-operative Plan:   Informed Consent: I have reviewed the patients History and Physical, chart, labs and discussed the procedure including the risks, benefits and alternatives for the proposed anesthesia with the patient or authorized representative who has indicated his/her understanding and acceptance.     Plan Discussed with: CRNA  Anesthesia Plan Comments:        Anesthesia Quick Evaluation

## 2015-06-14 NOTE — Anesthesia Postprocedure Evaluation (Signed)
Anesthesia Post Note  Patient: Molly Rivera  Procedure(s) Performed: Procedure(s) (LRB): TRANSESOPHAGEAL ECHOCARDIOGRAM (TEE) (N/A) CARDIOVERSION (N/A)  Patient location during evaluation: Other Anesthesia Type: General Level of consciousness: awake and alert Pain management: pain level controlled Vital Signs Assessment: post-procedure vital signs reviewed and stable Respiratory status: spontaneous breathing Cardiovascular status: stable Postop Assessment: No headache Anesthetic complications: no    Last Vitals:  Filed Vitals:   06/14/15 1445 06/14/15 1503  BP: 126/80 117/81  Pulse: 65 70  Temp:  36.4 C  Resp: 22     Last Pain:  Filed Vitals:   06/14/15 1503  PainSc: 2                  [name] S

## 2015-06-14 NOTE — Progress Notes (Signed)
Patient: Molly Rivera / Admit Date: 06/12/2015 / Date of Encounter: 06/14/2015, 8:05 AM   Subjective: Feeling better. Less SOB. No palpitations. With minimal ambulation in her room her HR will increase from a resting rate in the 80's to the 130's. Echo showed normal LV function, normal wall motion, mild to moderate MR, PASP 38 mm Hg.   Review of Systems: Review of Systems  Constitutional: Positive for weight loss and malaise/fatigue. Negative for fever, chills and diaphoresis.  HENT: Negative for congestion.   Eyes: Negative for discharge and redness.  Respiratory: Positive for shortness of breath. Negative for cough, hemoptysis, sputum production and wheezing.        Improved  Cardiovascular: Positive for palpitations and leg swelling. Negative for chest pain, orthopnea, claudication and PND.       Improved  Gastrointestinal: Negative for nausea, vomiting and abdominal pain.       Belching  Musculoskeletal: Negative for falls.  Skin: Negative for rash.  Neurological: Positive for weakness. Negative for dizziness, tingling, tremors, sensory change, speech change, focal weakness and loss of consciousness.  Endo/Heme/Allergies: Does not bruise/bleed easily.  Psychiatric/Behavioral: The patient is not nervous/anxious.   All other systems reviewed and are negative.    Objective: Telemetry: Afib, 80's, brief episode of Afib with RVR into the 130's this morning at 7:45 AM Physical Exam: Blood pressure 105/65, pulse 65, temperature 98.4 F (36.9 C), temperature source Oral, resp. rate 19, height  (1.575 m), weight 313 lb 9.6 oz (142.248 kg), SpO2 97 %. Body mass index is 57.34 kg/(m^2). General: Well developed, well nourished, in no acute distress. Head: Normocephalic, atraumatic, sclera non-icteric, no xanthomas, nares are without discharge. Neck: Negative for carotid bruits. JVP not elevated. Lungs: Clear bilaterally to auscultation without wheezes, rales, or rhonchi.  Breathing is unlabored. Heart: Irregularly-irregular, S1 S2 without murmurs, rubs, or gallops.  Abdomen: Soft, non-tender, non-distended with normoactive bowel sounds. No rebound/guarding. Extremities: No clubbing or cyanosis. Trace pre-tibial edema. Distal pedal pulses are 2+ and equal bilaterally. Neuro: Alert and oriented X 3. Moves all extremities spontaneously. Psych:  Responds to questions appropriately with a normal affect.   Intake/Output Summary (Last 24 hours) at 06/14/15 0805 Last data filed at 06/14/15 4696  Gross per 24 hour  Intake    960 ml  Output   2570 ml  Net  -1610 ml    Inpatient Medications:  . apixaban  5 mg Oral BID  . diltiazem  60 mg Oral 3 times per day  . furosemide  40 mg Intravenous BID  . lisinopril  10 mg Oral Daily  . metoprolol tartrate  50 mg Oral BID  . psyllium  1 packet Oral Daily   Infusions:    Labs:  Recent Labs  06/13/15 0418 06/14/15 0430  NA 139 142  K 3.1* 3.6  CL 103 106  CO2 30 29  GLUCOSE 117* 124*  BUN 15 20  CREATININE 0.83 0.92  CALCIUM 8.9 8.9   No results for input(s): AST, ALT, ALKPHOS, BILITOT, PROT, ALBUMIN in the last 72 hours.  Recent Labs  06/12/15 1019 06/13/15 0418  WBC 6.8 7.3  NEUTROABS 4.0  --   HGB 13.9 12.5  HCT 42.8 39.3  MCV 82.7 82.8  PLT 235 232    Recent Labs  06/12/15 1155  TROPONINI <0.03   Invalid input(s): POCBNP No results for input(s): HGBA1C in the last 72 hours.   Weights: Filed Weights   06/12/15 2952 06/12/15 1716  Weight: 315 lb (142.883 kg) 313 lb 9.6 oz (142.248 kg)     Radiology/Studies:  Dg Chest 2 View  06/12/2015  CLINICAL DATA:  Shortness of breath beginning this morning. History of asthma. EXAM: CHEST  2 VIEW COMPARISON:  None. FINDINGS: Heart is borderline in size. Lungs are clear. No effusions. No acute bony abnormality. Degenerative spurring in the thoracic spine. IMPRESSION: No active cardiopulmonary disease. Electronically Signed   By: Charlett NoseKevin  Dover  M.D.   On: 06/12/2015 11:02   Ct Angio Chest Pe W/cm &/or Wo Cm  06/12/2015  CLINICAL DATA:  New onset atrial fibrillation with shortness of Breath EXAM: CT ANGIOGRAPHY CHEST WITH CONTRAST TECHNIQUE: Multidetector CT imaging of the chest was performed using the standard protocol during bolus administration of intravenous contrast. Multiplanar CT image reconstructions and MIPs were obtained to evaluate the vascular anatomy. CONTRAST:  100mL OMNIPAQUE IOHEXOL 350 MG/ML SOLN COMPARISON:  None. FINDINGS: The lungs are well aerated bilaterally. Linear density is noted in the left lower lobe likely representing scarring. No focal confluent infiltrate or sizable effusion is noted. The thoracic inlet is within normal limits. The thoracic aorta shows no aneurysmal dilatation or dissection. The pulmonary artery shows a normal branching pattern. No intraluminal filling defect to suggest pulmonary embolism is identified. The left atrium is mildly prominent although no filling defects to suggest atrial thrombi are noted. No hilar or mediastinal adenopathy is seen. Scanning into the upper abdomen reveals no acute abnormality. The osseous structures show degenerative change of the thoracic spine. Review of the MIP images confirms the above findings. IMPRESSION: No evidence of pulmonary emboli. Likely left lower lobe scarring. No other focal abnormality is seen. Electronically Signed   By: Alcide CleverMark  Lukens M.D.   On: 06/12/2015 15:29     Assessment and Plan   1. New onset Afib with RVR: -Currently rate controlled in the 80's, though with minimal movement she will develop RVR into the 130's -She is for TEE/DCCV today at 1 PM -Continue diltiazem 60 mg q 8 hours hours with hold parameters  -Continue metoprolol 50 mg bid (consider her prior history of leg cramps associated with her history of hypokalemia as this may be unusual to have with beta blocker), may need to change dosing to 25 mg q 6 hours if she remains uncontrolled   -On Eliquis 5 mg bid, continue -CHADS2VASc at least 2 (HTN and sex category) giving her an estimated annual stroke risk of 2.2% -Echo showed showed normal LV function, normal wall motion, mild to moderate MR, PASP 38 mm Hg  2. HTN with associated hypokalemia: -Controlled -Check renin-angiotension-aldosterone level for hyperaldosteronism-->pending -On Lopressor 50 mg bid as above -Diltiazem 60 mg q 8 hours added as above  3. Sleep apnea: -On CPAP -Likely playing a role in #1  4. Asthma/allergies: -Controlled -Per IM  5. Morbid obesity: -Weight loss advised -Likely playing a role in #1  6. Hypokalemia: -Being repleted to goal of 4.0  7. Chest pain: -Negative troponin in the setting of Afib with RVR -Will schedule outpatient Myoview   Signed, Eula ListenRyan Dunn, PA-C Pager: [phone] 06/14/2015, 8:05 AM  Attending Note:   The patient was seen and examined.  Agree with assessment and plan as noted above.  Changes made to the above note as needed.  1. Atrial fib:  Pt has new dx of atrial fib.  Has had palpitations for years and now has been found to have A-fib. Started on Eliquis.   Plan is for TEE /  Cardioversion this afternoon . We discussed risks, benefits, options . She understands and agrees to proceed  Continue metoprolol and diltiazem for now    Alvia Grove., MD, [facility] 06/14/2015, 8:46 AM [address]  [address] Office [phone] Pager [phone]

## 2015-06-14 NOTE — Progress Notes (Signed)
Patient d/c'd home. Education provided, no questions at this time. Patient to drive self home, educated on safety. Patient has car at hospital and wants to drive self. Telemetry removed. Trudee KusterBrandi R Mansfield

## 2015-06-14 NOTE — Discharge Summary (Signed)
[facility]   PATIENT NAME: Molly Rivera    MR#:  [id number]  DATE OF BIRTH:  04/08/1961  DATE OF ADMISSION:  06/12/2015 ADMITTING PHYSICIAN: Enid Baasadhika Kalisetti, MD  DATE OF DISCHARGE: No discharge date for patient encounter.  PRIMARY CARE PHYSICIAN: Sandrea Hughsubio, Jessica, NP     ADMISSION DIAGNOSIS:  Dyspnea [R06.00] Atrial fibrillation, unspecified type (HCC) [I48.91]  DISCHARGE DIAGNOSIS:  Principal Problem:   A-fib (HCC) Active Problems:   Atrial fibrillation with RVR (HCC)   Obesity   Obstructive sleep apnea   Asthma   Essential hypertension   SECONDARY DIAGNOSIS:   Past Medical History  Diagnosis Date  . Allergy   . Arthritis   . Hypertension   . Sleep apnea   . Asthma   . A-fib Molly Rivera(HCC)     a. new onset 05/2015, b. CHADS2VASc => 2 (HTN and sex category); c. echo 05/2015: EF 50-55%, nl wall motion, mild to mod MR, PASP 38 mm Hg  . Morbid obesity (HCC)   . Diverticulitis   . Mitral regurgitation     a. echo 05/2015: mild to mod MR    .pro [facility] COURSE:  Patient is 54 year old after, and female with past medical history significant for the History of obstructive sleep apnea on CPAP at home, essential hypertension, asthma who presents to the [facility] with complaints of weakness and palpitations. On arrival to emergency room, she was noted to be in A. fib, RVR with a rate of 130. She was given IV Cardizem, also initiated on metoprolol. However, remained in atrial fibrillation, although her heart rate improved. She was seen by cardiologist and recommended TEE and cardioversion, which was performed on 06/14/2015. Patient converted into sinus rhythm. Cardiologist recommended to continue patient on Cardizem and metoprolol, as well as a liquids and follow-up with him as outpatient next few days after discharge. Discussion by problem # Paroxysmal atrial fibrillation with rapid ventricular rate-likely triggered by her  respiratory issues and OSA. - H80-100 , but remained in atrial fibrillation , that is post TEE and cardioversion by [facility] cardiology 23rd of November 2016 . Continue Lopressor at 50 BID and Cardizem CD at 120 mg once daily dose. Continue Eliquis at 5 mg twice daily dose. TEE results are still pending Follow-up by cardiology as outpatient, discussed with Dr.Nahser  # . Essential HTN Discontinue Norvasc , continue metoprolol as well as Cardizem CD for now until recommended differently by cardiology   # obstructive sleep apnea-continue CPAP at bedtime. Needs to have nocturnal oximetry study as outpatient to rule out significant hypoxemia episodes on current CPAP settings , could be done at home by CPAP provider versus sleep lab   # asthma-no acute exacerbation. Stable.  # DVT prophylaxis-on eliquis now  DISCHARGE CONDITIONS:   Stable  CONSULTS OBTAINED:  Treatment Team:  Iran OuchMuhammad A Arida, MD  DRUG ALLERGIES:   Allergies  Allergen Reactions  . Allegra [Fexofenadine] Other (See Comments)    cough  . Morphine And Related Nausea Only  . Claritin [Loratadine] Palpitations    DISCHARGE MEDICATIONS:   Current Discharge Medication List    START taking these medications   Details  apixaban (ELIQUIS) 5 MG TABS tablet Take 1 tablet (5 mg total) by mouth 2 (two) times daily. Qty: 60 tablet, Refills: 5    diltiazem (CARDIZEM CD) 120 MG 24 hr capsule Take 1 capsule (120 mg total) by mouth daily. Qty: 30 capsule, Refills: 3    metoprolol (LOPRESSOR) 50  MG tablet Take 1 tablet (50 mg total) by mouth 2 (two) times daily. Qty: 60 tablet, Refills: 5      CONTINUE these medications which have NOT CHANGED   Details  potassium chloride SA (K-DUR,KLOR-CON) 20 MEQ tablet Take 20 mEq by mouth daily.    ondansetron (ZOFRAN ODT) 4 MG disintegrating tablet Take 1 tablet (4 mg total) by mouth every 8 (eight) hours as needed for nausea or vomiting. Qty: 20 tablet, Refills: 0      STOP  taking these medications     amLODipine (NORVASC) 10 MG tablet      lisinopril (PRINIVIL,ZESTRIL) 10 MG tablet          DISCHARGE INSTRUCTIONS:    Patient is to follow-up with primary care physician and cardiologist as outpatient  If you experience worsening of your admission symptoms, develop shortness of breath, life threatening emergency, suicidal or homicidal thoughts you must seek medical attention immediately by calling 911 or calling your MD immediately  if symptoms less severe.  You Must read complete instructions/literature along with all the possible adverse reactions/side effects for all the Medicines you take and that have been prescribed to you. Take any new Medicines after you have completely understood and accept all the possible adverse reactions/side effects.   Please note  You were cared for by a hospitalist during your [facility] stay. If you have any questions about your discharge medications or the care you received while you were in the Rivera after you are discharged, you can call the unit and asked to speak with the hospitalist on call if the hospitalist that took care of you is not available. Once you are discharged, your primary care physician will handle any further medical issues. Please note that NO REFILLS for any discharge medications will be authorized once you are discharged, as it is imperative that you return to your primary care physician (or establish a relationship with a primary care physician if you do not have one) for your aftercare needs so that they can reassess your need for medications and monitor your lab values.    Today   CHIEF COMPLAINT:   Chief Complaint  Patient presents with  . Shortness of Breath    HISTORY OF PRESENT ILLNESS:  Molly Rivera  is a 54 y.o. female with a known history of History of obstructive sleep apnea on CPAP at home, essential hypertension, asthma who presents to the [facility] with complaints of weakness  and palpitations. On arrival to emergency room, she was noted to be in A. fib, RVR with a rate of 130. She was given IV Cardizem, also initiated on metoprolol. However, remained in atrial fibrillation, although her heart rate improved. She was seen by cardiologist and recommended TEE and cardioversion, which was performed on 06/14/2015. Patient converted into sinus rhythm. Cardiologist recommended to continue patient on Cardizem and metoprolol, as well as a liquids and follow-up with him as outpatient next few days after discharge. Discussion by problem # Paroxysmal atrial fibrillation with rapid ventricular rate-likely triggered by her respiratory issues and OSA. - H80-100 , but remained in atrial fibrillation , that is post TEE and cardioversion by [facility] cardiology 23rd of November 2016 . Continue Lopressor at 50 BID and Cardizem CD at 120 mg once daily dose. Continue Eliquis at 5 mg twice daily dose. TEE results are still pending Follow-up by cardiology as outpatient, discussed with Dr.Nahser  # . Essential HTN Discontinue Norvasc , continue metoprolol as well as Cardizem CD for  now until recommended differently by cardiology   # obstructive sleep apnea-continue CPAP at bedtime. Needs to have nocturnal oximetry study as outpatient to rule out significant hypoxemia episodes on current CPAP settings , could be done at home by CPAP provider versus sleep lab   # asthma-no acute exacerbation. Stable.  # DVT prophylaxis-on eliquis now    VITAL SIGNS:  Blood pressure 117/81, pulse 70, temperature 97.6 F (36.4 C), temperature source Oral, resp. rate 22, height  (1.575 m), weight 142.248 kg (313 lb 9.6 oz), SpO2 95 %.  I/O:   Intake/Output Summary (Last 24 hours) at 06/14/15 1652 Last data filed at 06/14/15 1355  Gross per 24 hour  Intake    580 ml  Output   2550 ml  Net  -1970 ml    PHYSICAL EXAMINATION:  GENERAL:  54 y.o.-year-old patient lying in the bed with no acute  distress.  EYES: Pupils equal, round, reactive to light and accommodation. No scleral icterus. Extraocular muscles intact.  HEENT: Head atraumatic, normocephalic. Oropharynx and nasopharynx clear.  NECK:  Supple, no jugular venous distention. No thyroid enlargement, no tenderness.  LUNGS: Normal breath sounds bilaterally, no wheezing, rales,rhonchi or crepitation. No use of accessory muscles of respiration.  CARDIOVASCULAR: S1, S2 normal. No murmurs, rubs, or gallops.  ABDOMEN: Soft, non-tender, non-distended. Bowel sounds present. No organomegaly or mass.  EXTREMITIES: No pedal edema, cyanosis, or clubbing.  NEUROLOGIC: Cranial nerves II through XII are intact. Muscle strength 5/5 in all extremities. Sensation intact. Gait not checked.  PSYCHIATRIC: The patient is alert and oriented x 3.  SKIN: No obvious rash, lesion, or ulcer.   DATA REVIEW:   CBC  Recent Labs Lab 06/13/15 0418  WBC 7.3  HGB 12.5  HCT 39.3  PLT 232    Chemistries   Recent Labs Lab 06/14/15 0430  NA 142  K 3.6  CL 106  CO2 29  GLUCOSE 124*  BUN 20  CREATININE 0.92  CALCIUM 8.9    Cardiac Enzymes  Recent Labs Lab 06/12/15 1155  TROPONINI <0.03    Microbiology Results  No results found for this or any previous visit.  RADIOLOGY:  No results found.  EKG:   Orders placed or performed during the Rivera encounter of 06/12/15  . ED EKG  . ED EKG  . EKG 12-Lead  . EKG 12-Lead  . EKG 12-Lead  . EKG 12-Lead      Management plans discussed with the patient, family and they are in agreement.  CODE STATUS:     Code Status Orders        Start     Ordered   06/12/15 1712  Full code   Continuous     06/12/15 1711      TOTAL TIME TAKING CARE OF THIS PATIENT: 40 minutes.    Katharina Caper M.D on 06/14/2015 at 4:52 PM  Between 7am to 6pm - Pager - [phone]  After 6pm go to [email or web address] - password [email or web address] [facility]  [facility]  Office   [phone]  CC: Primary care physician; Sandrea Hughs, NP

## 2015-06-14 NOTE — Progress Notes (Signed)
Called by nursing staff to resend medication prescroiptions to the different pharmacy as patient's home pharmacy was already closed, [facility], discussed with the nurse Merry ProudBrandi

## 2015-06-14 NOTE — Clinical Documentation Improvement (Signed)
Cardiology Internal Medicine  Can currently documented "HFpEF" be further clarified?   Diastolic CHF (please also specify acuity - acute, chronic, acute on chronic)   Other  Clinically Undetermined  Supporting Information: Echocardiogram done 11/21.    Please exercise your independent, professional judgment when responding. A specific answer is not anticipated or expected.Please update your documentation within the medical record to reflect your response to this query.   Thank you, Doy MinceVangela Dae Antonucci, RN [phone] Clinical Documentation Specialist

## 2015-06-14 NOTE — Progress Notes (Signed)
1839-- Patient called back to hospital stating that "I cannot pick up my prescription because the pharmacy it was sent to was closed and it is the holiday weekend. I cannot believe I was sent home at the last minute and I was made to drive home." As previously noted patient was educated on safety about driving home and patient was also told she could wait on her family to pick her up, however, she said "my family cannot come back, they already left." Charge nurse, Larose Hiresammy Todd, RN talked with patient's mother about miscommunication and asked which pharmacy they would like the prescriptions sent to, [facility] [address] [place], [place] was chosen. Dr. Winona LegatoVaickute was called about problems with picking up prescriptions, and sent prescription to new pharmacy. Patient also stated "I was told that someone had to be with me for 24 hours." Spoke with Dr. Winona LegatoVaickute about this as well and she stated that "I did not tell the patient that, and I spoke with cardiology, they have no restrictions either." I was present during patient physician encounter this morning and Dr. Winona LegatoVaickute did not state this and also told patient that she could be possibly be discharged if procedure went as planned. Patient was aware of this before discharge. Procedure went well and patient was stable at discharge. Trudee KusterBrandi R Mansfield

## 2015-06-15 LAB — HEMOGLOBIN A1C: Hgb A1c MFr Bld: 6.2 % — ABNORMAL HIGH (ref 4.0–6.0)

## 2015-06-16 LAB — ALDOSTERONE + RENIN ACTIVITY W/ RATIO
ALDO / PRA Ratio: 9 (ref 0.0–30.0)
ALDOSTERONE: 3.7 ng/dL (ref 0.0–30.0)
PRA LC/MS/MS: 0.41 ng/mL/h

## 2015-06-28 ENCOUNTER — Ambulatory Visit (INDEPENDENT_AMBULATORY_CARE_PROVIDER_SITE_OTHER): Payer: No Typology Code available for payment source | Admitting: Podiatry

## 2015-06-28 ENCOUNTER — Encounter: Payer: Self-pay | Admitting: Podiatry

## 2015-06-28 VITALS — BP 167/98 | HR 63 | Resp 16

## 2015-06-28 DIAGNOSIS — M722 Plantar fascial fibromatosis: Secondary | ICD-10-CM

## 2015-06-28 MED ORDER — METHYLPREDNISOLONE 4 MG PO TBPK: ORAL_TABLET | ORAL | Status: DC

## 2015-06-28 NOTE — Patient Instructions (Signed)

## 2015-06-28 NOTE — Progress Notes (Signed)
She presents today with chief complaint of a painful right heel. She states that even the orthotics do not seem to be helping at this point. She states that he was doing fine and that her foot started to hurt again.   Objective: Vital signs are stable moderate hypertension. she is alert and oriented 3. Pulses are palpable. Neurologic sensorium is intact. Pain on palpation medial calcaneal tubercle of the right heel.  Assessment: Plantar fasciitis right.  Plan: Injected the right heel today with Kenalog and local anesthetic. Put her plantar fascial brace and started her on a Medrol Dosepak.

## 2015-07-07 ENCOUNTER — Telehealth: Payer: Self-pay | Admitting: *Deleted

## 2015-07-07 NOTE — Telephone Encounter (Signed)
Pt states the heel is killing her to walk.  I told pt to rest, Ice and I could write her a note to be out of work, and make an appt for next week.  Pt states she is unable to be out of her new job, and will not be able to ice.  I told pt she could ice while at home until she went to work at 300pm today, and I told her if she was able to tolerate OTC Aleve or Ibuprofen to take as package directs and I would get her in to see a doctor next week.  Pt states if she can make through work today she has the weekend off and she will contact her pharmacist to see if she can take the Aleve with her medications.  I instructed pt to rest, ice this weekend, transferred to schedulers.

## 2015-07-10 ENCOUNTER — Ambulatory Visit: Payer: No Typology Code available for payment source | Admitting: Podiatry

## 2015-08-07 ENCOUNTER — Ambulatory Visit: Payer: No Typology Code available for payment source | Admitting: Podiatry

## 2015-09-25 ENCOUNTER — Encounter: Payer: Self-pay | Admitting: Emergency Medicine

## 2015-09-25 ENCOUNTER — Emergency Department: Payer: Commercial Managed Care - PPO

## 2015-09-25 ENCOUNTER — Emergency Department
Admission: EM | Admit: 2015-09-25 | Discharge: 2015-09-25 | Disposition: A | Discharge status: Home / Self Care | Payer: Commercial Managed Care - PPO | Attending: Emergency Medicine | Admitting: Emergency Medicine

## 2015-09-25 DIAGNOSIS — I10 Essential (primary) hypertension: Secondary | ICD-10-CM | POA: Insufficient documentation

## 2015-09-25 DIAGNOSIS — D571 Sickle-cell disease without crisis: Secondary | ICD-10-CM | POA: Diagnosis not present

## 2015-09-25 DIAGNOSIS — Z79899 Other long term (current) drug therapy: Secondary | ICD-10-CM | POA: Insufficient documentation

## 2015-09-25 DIAGNOSIS — N289 Disorder of kidney and ureter, unspecified: Secondary | ICD-10-CM | POA: Diagnosis not present

## 2015-09-25 DIAGNOSIS — R202 Paresthesia of skin: Secondary | ICD-10-CM | POA: Diagnosis not present

## 2015-09-25 DIAGNOSIS — M79604 Pain in right leg: Secondary | ICD-10-CM | POA: Diagnosis present

## 2015-09-25 DIAGNOSIS — E119 Type 2 diabetes mellitus without complications: Secondary | ICD-10-CM | POA: Diagnosis not present

## 2015-09-25 NOTE — Discharge Instructions (Signed)
Paresthesia  Paresthesia is a burning or prickling feeling. This feeling can happen in any part of the body. It often happens in the hands, arms, legs, or feet. Usually, it is not painful. In most cases, the feeling goes away in a short time and is not a sign of a serious problem.  HOME CARE  · Avoid drinking alcohol.  · Try massage or needle therapy (acupuncture) to help with your problems.  · Keep all follow-up visits as told by your doctor. This is important.  GET HELP IF:  · You keep on having episodes of paresthesia.  · Your burning or prickling feeling gets worse when you walk.  · You have pain or cramps.  · You feel dizzy.  · You have a rash.  GET HELP RIGHT AWAY IF:  · You feel weak.  · You have trouble walking or moving.  · You have problems speaking, understanding, or seeing.  · You feel confused.  · You cannot control when you pee (urinate) or poop (bowel movement).  · You lose feeling (numbness) after an injury.  · You pass out (faint).     This information is not intended to replace advice given to you by your health care provider. Make sure you discuss any questions you have with your health care provider.     Document Released: 06/20/2008 Document Revised: 11/22/2014 Document Reviewed: 07/04/2014  Elsevier Interactive Patient Education ©2016 [employer]

## 2015-09-25 NOTE — ED Notes (Signed)
Reports right hip pain rad to knee.  Ambulates well to triage

## 2015-09-25 NOTE — ED Provider Notes (Signed)
[facility] Emergency Department Provider Note  ____________________________________________  Time seen: Approximately 7:30 AM  I have reviewed the triage vital signs and the nursing notes.   HISTORY  Chief Complaint Leg Pain    HPI Molly Rivera is a 55 y.o. female patient complained of pain to right leg radiates into the hip for one week. Patient denies any injury. Patient described the pain as a burning. Patient is concerned because she takes Apixabanbut has not been compliant. Patient denies any chest pain or shortness of breath.Patient rates her pain every morning but denies pain at this time. No palliative measures taken for this complaint.   Past Medical History  Diagnosis Date  . Allergy   . Arthritis   . Hypertension   . Sleep apnea   . Asthma   . A-fib [facility](HCC)     a. new onset 05/2015, b. CHADS2VASc => 2 (HTN and sex category); c. echo 05/2015: EF 50-55%, nl wall motion, mild to mod MR, PASP 38 mm Hg  . Morbid obesity (HCC)   . Diverticulitis   . Mitral regurgitation     a. echo 05/2015: mild to mod MR    Patient Active Problem List   Diagnosis Date Noted  . Obesity 06/14/2015  . Obstructive sleep apnea 06/14/2015  . Asthma 06/14/2015  . Essential hypertension 06/14/2015  . Essential (primary) hypertension 06/14/2015  . Airway hyperreactivity 06/14/2015  . Atrial fibrillation with RVR (HCC) 06/13/2015  . A-fib (HCC) 06/12/2015  . Atrial fibrillation (HCC) 06/12/2015    Past Surgical History  Procedure Laterality Date  . Knee surgery    . Abdominal hysterectomy    . Heel spur excision Left   . Cesarean section    . Tee without cardioversion N/A 06/14/2015    Procedure: TRANSESOPHAGEAL ECHOCARDIOGRAM (TEE);  Surgeon: Vesta MixerPhilip J Nahser, MD;  Location: [facility] ORS;  Service: Cardiovascular;  Laterality: N/A;  . Electrophysiologic study N/A 06/14/2015    Procedure: CARDIOVERSION;  Surgeon: Vesta MixerPhilip J Nahser, MD;  Location: [facility] ORS;   Service: Cardiovascular;  Laterality: N/A;    Current Outpatient Rx  Name  Route  Sig  Dispense  Refill  . furosemide (LASIX) 40 MG tablet   Oral   Take 40 mg by mouth.         Marland Kitchen. apixaban (ELIQUIS) 5 MG TABS tablet   Oral   Take 1 tablet (5 mg total) by mouth 2 (two) times daily.   60 tablet   5   . diltiazem (CARDIZEM CD) 120 MG 24 hr capsule   Oral   Take 1 capsule (120 mg total) by mouth daily.   30 capsule   3   . Furosemide (LASIX PO)   Oral   Take by mouth.         . methylPREDNISolone (MEDROL) 4 MG TBPK tablet      Tapering 6 day dose pack   21 tablet   0   . metoprolol (LOPRESSOR) 50 MG tablet   Oral   Take 1 tablet (50 mg total) by mouth 2 (two) times daily.   60 tablet   5   . potassium chloride SA (K-DUR,KLOR-CON) 20 MEQ tablet   Oral   Take 20 mEq by mouth daily.           Allergies Allegra; Morphine and related; Other; and Claritin  Family History  Problem Relation Age of Onset  . Adopted: Yes  . Hypertension Mother   . Diabetes Mother  possible  . Schizophrenia Mother   . Healthy Son     Social History Social History  Substance Use Topics  . Smoking status: Never Smoker   . Smokeless tobacco: Never Used  . Alcohol Use: 0.0 oz/week    0 Standard drinks or equivalent per week     Comment: occasional beer    Review of Systems Constitutional: No fever/chills Eyes: No visual changes. ENT: No sore throat. Cardiovascular: Denies chest pain. Respiratory: Denies shortness of breath. Gastrointestinal: No abdominal pain.  No nausea, no vomiting.  No diarrhea.  No constipation. Genitourinary: Negative for dysuria. Musculoskeletal: Negative for back pain. Skin: Negative for rash. Endocrine:Hypertension, diabetes, Allergic/Immunilogical: Renal insufficiency sickle cell anemia   ____________________________________________   PHYSICAL EXAM:  VITAL SIGNS: ED Triage Vitals  Enc Vitals Group     BP 09/25/15 0718 192/94 mmHg      Pulse Rate 09/25/15 0718 66     Resp 09/25/15 0718 18     Temp 09/25/15 0718 97.6 F (36.4 C)     Temp Source 09/25/15 0718 Oral     SpO2 09/25/15 0718 99 %     Weight 09/25/15 0718 323 lb (146.512 kg)     Height 09/25/15 0718  (1.575 m)     Head Cir --      Peak Flow --      Pain Score --      Pain Loc --      Pain Edu? --      Excl. in GC? --     Constitutional: Alert and oriented. Well appearing and in no acute distress. Morbid obesity Eyes: Conjunctivae are normal. PERRL. EOMI. Head: Atraumatic. Nose: No congestion/rhinnorhea. Mouth/Throat: Mucous membranes are moist.  Oropharynx non-erythematous. Neck: No stridor.  No cervical spine tenderness to palpation. Hematological/Lymphatic/Immunilogical: No cervical lymphadenopathy. Cardiovascular: Normal rate, regular rhythm. Grossly normal heart sounds.  Good peripheral circulation. Elevated blood pressure Respiratory: Normal respiratory effort.  No retractions. Lungs CTAB. Gastrointestinal: Soft and nontender. No distention. No abdominal bruits. No CVA tenderness. Musculoskeletal: No lower extremity tenderness nor edema.  No joint effusions. Neurologic:  Normal speech and language. No gross focal neurologic deficits are appreciated. No gait instability. Skin:  Skin is warm, dry and intact. No rash noted. Psychiatric: Mood and affect are normal. Speech and behavior are normal.  ____________________________________________   LABS (all labs ordered are listed, but only abnormal results are displayed)  Labs Reviewed - No data to display ____________________________________________  EKG   ____________________________________________  RADIOLOGY  Ultrasound negative for DVT. ____________________________________________   PROCEDURES  Procedure(s) performed: None  Critical Care performed: No  ____________________________________________   INITIAL IMPRESSION / ASSESSMENT AND PLAN / ED COURSE  Pertinent labs  & imaging results that were available during my care of the patient were reviewed by me and considered in my medical decision making (see chart for details). Paresthesias of the right leg. He given discharge care instructions. Patient advised follow-up family doctor for definitive care planning. ____________________________________________   FINAL CLINICAL IMPRESSION(S) / ED DIAGNOSES  Final diagnoses:  Paresthesia of right leg      Joni Reining, PA-C 09/25/15 1610  Rockne Menghini, MD 09/25/15 1535

## 2015-09-25 NOTE — ED Notes (Signed)
States she developed pain to right hip area about1 week ago w/o injury. Describes pain as burning type pain   Which radiates into lateral knee  Ambulates well to treatment room

## 2015-10-11 ENCOUNTER — Emergency Department
Admission: EM | Admit: 2015-10-11 | Discharge: 2015-10-11 | Disposition: A | Discharge status: Home / Self Care | Payer: Commercial Managed Care - PPO | Attending: Emergency Medicine | Admitting: Emergency Medicine

## 2015-10-11 ENCOUNTER — Encounter: Payer: Self-pay | Admitting: Emergency Medicine

## 2015-10-11 ENCOUNTER — Emergency Department: Payer: Commercial Managed Care - PPO

## 2015-10-11 DIAGNOSIS — Z7901 Long term (current) use of anticoagulants: Secondary | ICD-10-CM | POA: Diagnosis not present

## 2015-10-11 DIAGNOSIS — J069 Acute upper respiratory infection, unspecified: Secondary | ICD-10-CM

## 2015-10-11 DIAGNOSIS — R05 Cough: Secondary | ICD-10-CM | POA: Diagnosis present

## 2015-10-11 DIAGNOSIS — I10 Essential (primary) hypertension: Secondary | ICD-10-CM | POA: Diagnosis not present

## 2015-10-11 DIAGNOSIS — Z79899 Other long term (current) drug therapy: Secondary | ICD-10-CM | POA: Diagnosis not present

## 2015-10-11 MED ORDER — ALBUTEROL SULFATE HFA 108 (90 BASE) MCG/ACT IN AERS: 2.0000 | INHALATION_SPRAY | Freq: Four times a day (QID) | RESPIRATORY_TRACT | Status: DC | PRN

## 2015-10-11 MED ORDER — IPRATROPIUM-ALBUTEROL 0.5-2.5 (3) MG/3ML IN SOLN
3.0000 mL | Freq: Once | RESPIRATORY_TRACT | Status: AC
  Administered 2015-10-11: 3 mL via RESPIRATORY_TRACT
  Filled 2015-10-11: qty 3

## 2015-10-11 NOTE — ED Provider Notes (Signed)
[facility] Emergency Department Provider Note  ____________________________________________  Time seen: Approximately 10:40 AM  I have reviewed the triage vital signs and the nursing notes.   HISTORY  Chief Complaint Cough   HPI Molly Rivera is a 55 y.o. female is here complaining of cough and congestion for 3 days. Patient states she's been taking Robitussin over-the-counter without any relief. Patient is unaware of any fever. Denies any nausea, vomiting, or diarrhea. Patient states she does not feel like she is better than 3 days ago.Patient rates her pain as 7/10. Patient denies smoking. She states that during her childhood she did have asthma she has in the past also used an inhaler but does not have one at present.   Past Medical History  Diagnosis Date  . Allergy   . Arthritis   . Hypertension   . Sleep apnea   . Asthma   . A-fib [facility])     a. new onset 05/2015, b. CHADS2VASc => 2 (HTN and sex category); c. echo 05/2015: EF 50-55%, nl wall motion, mild to mod MR, PASP 38 mm Hg  . Morbid obesity (HCC)   . Diverticulitis   . Mitral regurgitation     a. echo 05/2015: mild to mod MR    Patient Active Problem List   Diagnosis Date Noted  . Obesity 06/14/2015  . Obstructive sleep apnea 06/14/2015  . Asthma 06/14/2015  . Essential hypertension 06/14/2015  . Essential (primary) hypertension 06/14/2015  . Airway hyperreactivity 06/14/2015  . Atrial fibrillation with RVR (HCC) 06/13/2015  . A-fib (HCC) 06/12/2015  . Atrial fibrillation (HCC) 06/12/2015    Past Surgical History  Procedure Laterality Date  . Knee surgery    . Abdominal hysterectomy    . Heel spur excision Left   . Cesarean section    . Tee without cardioversion N/A 06/14/2015    Procedure: TRANSESOPHAGEAL ECHOCARDIOGRAM (TEE);  Surgeon: Vesta Mixer, MD;  Location: [facility] ORS;  Service: Cardiovascular;  Laterality: N/A;  . Electrophysiologic study N/A 06/14/2015   Procedure: CARDIOVERSION;  Surgeon: Vesta Mixer, MD;  Location: [facility] ORS;  Service: Cardiovascular;  Laterality: N/A;    Current Outpatient Rx  Name  Route  Sig  Dispense  Refill  . albuterol (PROVENTIL HFA;VENTOLIN HFA) 108 (90 Base) MCG/ACT inhaler   Inhalation   Inhale 2 puffs into the lungs every 6 (six) hours as needed for wheezing or shortness of breath.   1 Inhaler   2   . apixaban (ELIQUIS) 5 MG TABS tablet   Oral   Take 1 tablet (5 mg total) by mouth 2 (two) times daily.   60 tablet   5   . diltiazem (CARDIZEM CD) 120 MG 24 hr capsule   Oral   Take 1 capsule (120 mg total) by mouth daily.   30 capsule   3   . Furosemide (LASIX PO)   Oral   Take by mouth.         . furosemide (LASIX) 40 MG tablet   Oral   Take 40 mg by mouth.         . methylPREDNISolone (MEDROL) 4 MG TBPK tablet      Tapering 6 day dose pack   21 tablet   0   . metoprolol (LOPRESSOR) 50 MG tablet   Oral   Take 1 tablet (50 mg total) by mouth 2 (two) times daily.   60 tablet   5   . potassium chloride SA (K-DUR,KLOR-CON) 20 MEQ  tablet   Oral   Take 20 mEq by mouth daily.           Allergies Allegra; Morphine and related; Other; and Claritin  Family History  Problem Relation Age of Onset  . Adopted: Yes  . Hypertension Mother   . Diabetes Mother     possible  . Schizophrenia Mother   . Healthy Son     Social History Social History  Substance Use Topics  . Smoking status: Never Smoker   . Smokeless tobacco: Never Used  . Alcohol Use: 0.0 oz/week    0 Standard drinks or equivalent per week     Comment: occasional beer    Review of Systems Constitutional: No fever/chills ENT: No sore throat. Cardiovascular: Denies chest pain. Respiratory: Denies shortness of breath.Positive cough. Gastrointestinal: No abdominal pain.  No nausea, no vomiting.  No diarrhea.   Genitourinary: Negative for dysuria. Musculoskeletal: Positive generalized body aches. Skin: Negative  for rash. Neurological: Negative for headaches, focal weakness or numbness.  10-point ROS otherwise negative.  ____________________________________________   PHYSICAL EXAM:  VITAL SIGNS: ED Triage Vitals  Enc Vitals Group     BP 10/11/15 1035 163/110 mmHg     Pulse Rate 10/11/15 1035 73     Resp 10/11/15 1035 16     Temp 10/11/15 1035 97.6 F (36.4 C)     Temp Source 10/11/15 1035 Oral     SpO2 10/11/15 1035 97 %     Weight 10/11/15 1035 315 lb (142.883 kg)     Height 10/11/15 1035 5\' 2"  (1.575 m)     Head Cir --      Peak Flow --      Pain Score 10/11/15 1036 7     Pain Loc --      Pain Edu? --      Excl. in GC? --     Constitutional: Alert and oriented. Well appearing and in no acute distress. Eyes: Conjunctivae are normal. PERRL. EOMI. Head: Atraumatic. Nose: Mild congestion/rhinnorhea.  ACs and TMs are clear bilaterally. Mouth/Throat: Mucous membranes are moist.  Oropharynx non-erythematous. Neck: No stridor.  Supple. Hematological/Lymphatic/Immunilogical: No cervical lymphadenopathy. Cardiovascular: Normal rate, regular rhythm. Grossly normal heart sounds.  Good peripheral circulation. Respiratory: Normal respiratory effort.  No retractions. Lungs there is mild bilateral expiratory wheezes heard throughout which cleared with cough. Patient does not appear to be any respiratory distress. Patient speaks in complete sentences without any difficulty. Gastrointestinal: Soft and nontender. No distention. No abdominal bruits. No CVA tenderness. Musculoskeletal: No lower extremity tenderness nor edema.  No joint effusions. Neurologic:  Normal speech and language. No gross focal neurologic deficits are appreciated. No gait instability. Skin:  Skin is warm, dry and intact. No rash noted. Psychiatric: Mood and affect are normal. Speech and behavior are normal.  ____________________________________________   LABS (all labs ordered are listed, but only abnormal results are  displayed)  Labs Reviewed - No data to display  RADIOLOGY  Chest x-ray shows no acute cardiopulmonary disease per radiologist. ____________________________________________   PROCEDURES  Procedure(s) performed: None  Critical Care performed: No  ____________________________________________   INITIAL IMPRESSION / ASSESSMENT AND PLAN / ED COURSE  Pertinent labs & imaging results that were available during my care of the patient were reviewed by me and considered in my medical decision making (see chart for details).  She will continue taking over-the-counter Robitussin/cold and flu. She is encouraged take Tylenol or ibuprofen as needed for fever or muscle aches. She is also  given a prescription for albuterol inhaler to use when necessary wheezing. She is to follow-up with her Dr. Phineas Real if any continued problems. ____________________________________________   FINAL CLINICAL IMPRESSION(S) / ED DIAGNOSES  Final diagnoses:  Acute upper respiratory infection      Tommi Rumps, PA-C 10/11/15 1215  Myrna Blazer, MD 10/16/15 2038

## 2015-10-11 NOTE — ED Notes (Signed)
Pt reports some relief after breathing tx.

## 2015-10-11 NOTE — ED Notes (Signed)
Pt with cough, congestion x3 days.

## 2015-10-11 NOTE — Discharge Instructions (Signed)
Upper Respiratory Infection, Adult Most upper respiratory infections (URIs) are caused by a virus. A URI affects the nose, throat, and upper air passages. The most common type of URI is often called "the common cold." HOME CARE   Take medicines only as told by your doctor.  Gargle warm saltwater or take cough drops to comfort your throat as told by your doctor.  Use a warm mist humidifier or inhale steam from a shower to increase air moisture. This may make it easier to breathe.  Drink enough fluid to keep your pee (urine) clear or pale yellow.  Eat soups and other clear broths.  Have a healthy diet.  Rest as needed.  Go back to work when your fever is gone or your doctor says it is okay.  You may need to stay home longer to avoid giving your URI to others.  You can also wear a face mask and wash your hands often to prevent spread of the virus.  Use your inhaler more if you have asthma.  Do not use any tobacco products, including cigarettes, chewing tobacco, or electronic cigarettes. If you need help quitting, ask your doctor. GET HELP IF:  You are getting worse, not better.  Your symptoms are not helped by medicine.  You have chills.  You are getting more short of breath.  You have brown or red mucus.  You have yellow or brown discharge from your nose.  You have pain in your face, especially when you bend forward.  You have a fever.  You have puffy (swollen) neck glands.  You have pain while swallowing.  You have white areas in the back of your throat. GET HELP RIGHT AWAY IF:   You have very bad or constant:  Headache.  Ear pain.  Pain in your forehead, behind your eyes, and over your cheekbones (sinus pain).  Chest pain.  You have long-lasting (chronic) lung disease and any of the following:  Wheezing.  Long-lasting cough.  Coughing up blood.  A change in your usual mucus.  You have a stiff neck.  You have changes in  your:  Vision.  Hearing.  Thinking.  Mood. MAKE SURE YOU:   Understand these instructions.  Will watch your condition.  Will get help right away if you are not doing well or get worse.   This information is not intended to replace advice given to you by your health care provider. Make sure you discuss any questions you have with your health care provider.   Document Released: 12/25/2007 Document Revised: 11/22/2014 Document Reviewed: 10/13/2013 Elsevier Interactive Patient Education 2016 [employer]    Continue over the counter medication for cough and congestion. Begin using your inhaler as needed for cough and wheezing every 4 hours if needed. Increase fluids. Take Tylenol or ibuprofen as needed for fever and body aches. Follow-up with your primary care doctor if any continued problems.

## 2016-01-22 ENCOUNTER — Emergency Department: Payer: Commercial Managed Care - PPO

## 2016-01-22 ENCOUNTER — Emergency Department
Admission: EM | Admit: 2016-01-22 | Discharge: 2016-01-23 | Disposition: A | Discharge status: Home / Self Care | Payer: Commercial Managed Care - PPO | Attending: Emergency Medicine | Admitting: Emergency Medicine

## 2016-01-22 DIAGNOSIS — I10 Essential (primary) hypertension: Secondary | ICD-10-CM | POA: Diagnosis not present

## 2016-01-22 DIAGNOSIS — J45909 Unspecified asthma, uncomplicated: Secondary | ICD-10-CM | POA: Diagnosis not present

## 2016-01-22 DIAGNOSIS — M79605 Pain in left leg: Secondary | ICD-10-CM | POA: Diagnosis present

## 2016-01-22 DIAGNOSIS — E876 Hypokalemia: Secondary | ICD-10-CM

## 2016-01-22 DIAGNOSIS — M1990 Unspecified osteoarthritis, unspecified site: Secondary | ICD-10-CM | POA: Diagnosis not present

## 2016-01-22 DIAGNOSIS — I4891 Unspecified atrial fibrillation: Secondary | ICD-10-CM | POA: Insufficient documentation

## 2016-01-22 DIAGNOSIS — Z6841 Body Mass Index (BMI) 40.0 and over, adult: Secondary | ICD-10-CM | POA: Insufficient documentation

## 2016-01-22 NOTE — ED Notes (Signed)
Pt states greater than 3 months of left posterior leg pain from mid thigh to ankle. Pt denies long periods of stasis. Pt denies shob, hemoptysis.

## 2016-01-23 LAB — BASIC METABOLIC PANEL
Anion gap: 6 (ref 5–15)
BUN: 15 mg/dL (ref 6–20)
CHLORIDE: 107 mmol/L (ref 101–111)
CO2: 28 mmol/L (ref 22–32)
CREATININE: 0.85 mg/dL (ref 0.44–1.00)
Calcium: 9.1 mg/dL (ref 8.9–10.3)
GFR calc Af Amer: >60 mL/min (ref >60)
GFR calc non Af Amer: >60 mL/min (ref >60)
Glucose, Bld: 113 mg/dL — ABNORMAL HIGH (ref 65–99)
Potassium: 3 mmol/L — ABNORMAL LOW (ref 3.5–5.1)
Sodium: 141 mmol/L (ref 135–145)

## 2016-01-23 LAB — MAGNESIUM: Magnesium: 2.2 mg/dL (ref 1.7–2.4)

## 2016-01-23 LAB — CK: Total CK: 194 U/L (ref 38–234)

## 2016-01-23 MED ORDER — NAPROXEN 500 MG PO TABS: 500.0000 mg | ORAL_TABLET | Freq: Two times a day (BID) | ORAL | Status: DC

## 2016-01-23 MED ORDER — KETOROLAC TROMETHAMINE 30 MG/ML IJ SOLN
60.0000 mg | Freq: Once | INTRAMUSCULAR | Status: AC
  Administered 2016-01-23: 60 mg via INTRAMUSCULAR
  Filled 2016-01-23: qty 2

## 2016-01-23 MED ORDER — POTASSIUM CHLORIDE CRYS ER 20 MEQ PO TBCR
40.0000 meq | EXTENDED_RELEASE_TABLET | Freq: Once | ORAL | Status: AC
  Administered 2016-01-23: 40 meq via ORAL
  Filled 2016-01-23: qty 2

## 2016-01-23 NOTE — ED Notes (Signed)
Discharge instructions reviewed with patient. Patient verbalized understanding. Patient ambulated to lobby without difficulty.   

## 2016-01-23 NOTE — Discharge Instructions (Signed)
1. You may take pain medicine as needed (naproxen 500 mg twice daily 7 days). 2. You may remove Ace wrap to bathe and sleep. 3. Return to the ER for worsening symptoms, persistent vomiting, difficulty breathing or other concerns.  Musculoskeletal Pain Musculoskeletal pain is muscle and boney aches and pains. These pains can occur in any part of the body. Your caregiver may treat you without knowing the cause of the pain. They may treat you if blood or urine tests, X-rays, and other tests were normal.  CAUSES There is often not a definite cause or reason for these pains. These pains may be caused by a type of germ (virus). The discomfort may also come from overuse. Overuse includes working out too hard when your body is not fit. Boney aches also come from weather changes. Bone is sensitive to atmospheric pressure changes. HOME CARE INSTRUCTIONS   Ask when your test results will be ready. Make sure you get your test results.  Only take over-the-counter or prescription medicines for pain, discomfort, or fever as directed by your caregiver. If you were given medications for your condition, do not drive, operate machinery or power tools, or sign legal documents for 24 hours. Do not drink alcohol. Do not take sleeping pills or other medications that may interfere with treatment.  Continue all activities unless the activities cause more pain. When the pain lessens, slowly resume normal activities. Gradually increase the intensity and duration of the activities or exercise.  During periods of severe pain, bed rest may be helpful. Lay or sit in any position that is comfortable.  Putting ice on the injured area.  Put ice in a bag.  Place a towel between your skin and the bag.  Leave the ice on for 15 to 20 minutes, 3 to 4 times a day.  Follow up with your caregiver for continued problems and no reason can be found for the pain. If the pain becomes worse or does not go away, it may be necessary to  repeat tests or do additional testing. Your caregiver may need to look further for a possible cause. SEEK IMMEDIATE MEDICAL CARE IF:  You have pain that is getting worse and is not relieved by medications.  You develop chest pain that is associated with shortness or breath, sweating, feeling sick to your stomach (nauseous), or throw up (vomit).  Your pain becomes localized to the abdomen.  You develop any new symptoms that seem different or that concern you. MAKE SURE YOU:   Understand these instructions.  Will watch your condition.  Will get help right away if you are not doing well or get worse.   This information is not intended to replace advice given to you by your health care provider. Make sure you discuss any questions you have with your health care provider.   Document Released: 07/08/2005 Document Revised: 09/30/2011 Document Reviewed: 03/12/2013 Elsevier Interactive Patient Education 2016 [employer]  Hypokalemia Hypokalemia means that the amount of potassium in the blood is lower than normal.Potassium is a chemical, called an electrolyte, that helps regulate the amount of fluid in the body. It also stimulates muscle contraction and helps nerves function properly.Most of the body's potassium is inside of cells, and only a very small amount is in the blood. Because the amount in the blood is so small, minor changes can be life-threatening. CAUSES  Antibiotics.  Diarrhea or vomiting.  Using laxatives too much, which can cause diarrhea.  Chronic kidney disease.  Water pills (  diuretics).  Eating disorders (bulimia).  Low magnesium level.  Sweating a lot. SIGNS AND SYMPTOMS  Weakness.  Constipation.  Fatigue.  Muscle cramps.  Mental confusion.  Skipped heartbeats or irregular heartbeat (palpitations).  Tingling or numbness. DIAGNOSIS  Your health care provider can diagnose hypokalemia with blood tests. In addition to checking your potassium level,  your health care provider may also check other lab tests. TREATMENT Hypokalemia can be treated with potassium supplements taken by mouth or adjustments in your current medicines. If your potassium level is very low, you may need to get potassium through a vein (IV) and be monitored in the hospital. A diet high in potassium is also helpful. Foods high in potassium are:  Nuts, such as peanuts and pistachios.  Seeds, such as sunflower seeds and pumpkin seeds.  Peas, lentils, and lima beans.  Whole grain and bran cereals and breads.  Fresh fruit and vegetables, such as apricots, avocado, bananas, cantaloupe, kiwi, oranges, tomatoes, asparagus, and potatoes.  Orange and tomato juices.  Red meats.  Fruit yogurt. HOME CARE INSTRUCTIONS  Take all medicines as prescribed by your health care provider.  Maintain a healthy diet by including nutritious food, such as fruits, vegetables, nuts, whole grains, and lean meats.  If you are taking a laxative, be sure to follow the directions on the label. SEEK MEDICAL CARE IF:  Your weakness gets worse.  You feel your heart pounding or racing.  You are vomiting or having diarrhea.  You are diabetic and having trouble keeping your blood glucose in the normal range. SEEK IMMEDIATE MEDICAL CARE IF:  You have chest pain, shortness of breath, or dizziness.  You are vomiting or having diarrhea for more than 2 days.  You faint. MAKE SURE YOU:   Understand these instructions.  Will watch your condition.  Will get help right away if you are not doing well or get worse.   This information is not intended to replace advice given to you by your health care provider. Make sure you discuss any questions you have with your health care provider.   Document Released: 07/08/2005 Document Revised: 07/29/2014 Document Reviewed: 01/08/2013 Elsevier Interactive Patient Education 2016 [employer]vier Inc.  Potassium Content of Foods Potassium is a mineral  found in many foods and drinks. It helps keep fluids and minerals balanced in your body and affects how steadily your heart beats. Potassium also helps control your blood pressure and keep your muscles and nervous system healthy. Certain health conditions and medicines may change the balance of potassium in your body. When this happens, you can help balance your level of potassium through the foods that you do or do not eat. Your health care provider or dietitian may recommend an amount of potassium that you should have each day. The following lists of foods provide the amount of potassium (in parentheses) per serving in each item. HIGH IN POTASSIUM  The following foods and beverages have 200 mg or more of potassium per serving:  Apricots, 2 raw or 5 dry (200 mg).  Artichoke, 1 medium (345 mg).  Avocado, raw,  each (245 mg).  Banana, 1 medium (425 mg).  Beans, lima, or baked beans, canned,  cup (280 mg).  Beans, white, canned,  cup (595 mg).  Beef roast, 3 oz (320 mg).  Beef, ground, 3 oz (270 mg).  Beets, raw or cooked,  cup (260 mg).  Bran muffin, 2 oz (300 mg).  Broccoli,  cup (230 mg).  Brussels sprouts,  cup (250  mg).  Cantaloupe,  cup (215 mg).  Cereal, 100% bran,  cup (200-400 mg).  Cheeseburger, single, fast food, 1 each (225-400 mg).  Chicken, 3 oz (220 mg).  Clams, canned, 3 oz (535 mg).  Crab, 3 oz (225 mg).  Dates, 5 each (270 mg).  Dried beans and peas,  cup (300-475 mg).  Figs, dried, 2 each (260 mg).  Fish: halibut, tuna, cod, snapper, 3 oz (480 mg).  Fish: salmon, haddock, swordfish, perch, 3 oz (300 mg).  Fish, tuna, canned 3 oz (200 mg).  [place]ama[place] fries, fast food, 3 oz (470 mg).  Granola with fruit and nuts,  cup (200 mg).  Grapefruit juice,  cup (200 mg).  Greens, beet,  cup (655 mg).  Honeydew melon,  cup (200 mg).  Kale, raw, 1 cup (300 mg).  Kiwi, 1 medium (240 mg).  Kohlrabi, rutabaga, parsnips,  cup (280  mg).  Lentils,  cup (365 mg).  Mango, 1 each (325 mg).  Milk, chocolate, 1 cup (420 mg).  Milk: nonfat, low-fat, whole, buttermilk, 1 cup (350-380 mg).  Molasses, 1 Tbsp (295 mg).  Mushrooms,  cup (280) mg.  Nectarine, 1 each (275 mg).  Nuts: almonds, peanuts, hazelnuts, [place], cashew, mixed, 1 oz (200 mg).  Nuts, pistachios, 1 oz (295 mg).  Orange, 1 each (240 mg).  Orange juice,  cup (235 mg).  Papaya, medium,  fruit (390 mg).  Peanut butter, chunky, 2 Tbsp (240 mg).  Peanut butter, smooth, 2 Tbsp (210 mg).  Pear, 1 medium (200 mg).  Pomegranate, 1 whole (400 mg).  Pomegranate juice,  cup (215 mg).  Pork, 3 oz (350 mg).  Potato chips, salted, 1 oz (465 mg).  Potato, baked with skin, 1 medium (925 mg).  Potatoes, boiled,  cup (255 mg).  Potatoes, mashed,  cup (330 mg).  Prune juice,  cup (370 mg).  Prunes, 5 each (305 mg).  Pudding, chocolate,  cup (230 mg).  Pumpkin, canned,  cup (250 mg).  Raisins, seedless,  cup (270 mg).  Seeds, sunflower or pumpkin, 1 oz (240 mg).  Soy milk, 1 cup (300 mg).  Spinach,  cup (420 mg).  Spinach, canned,  cup (370 mg).  Sweet potato, baked with skin, 1 medium (450 mg).  Swiss chard,  cup (480 mg).  Tomato or vegetable juice,  cup (275 mg).  Tomato sauce or puree,  cup (400-550 mg).  Tomato, raw, 1 medium (290 mg).  Tomatoes, canned,  cup (200-300 mg).  [place], 3 oz (250 mg).  Wheat germ, 1 oz (250 mg).  Winter squash,  cup (250 mg).  Yogurt, plain or fruited, 6 oz (260-435 mg).  Zucchini,  cup (220 mg). MODERATE IN POTASSIUM The following foods and beverages have 50-200 mg of potassium per serving:  Apple, 1 each (150 mg).  Apple juice,  cup (150 mg).  Applesauce,  cup (90 mg).  Apricot nectar,  cup (140 mg).  Asparagus, small spears,  cup or 6 spears (155 mg).  Bagel, cinnamon raisin, 1 each (130 mg).  Bagel, egg or plain, 4 in., 1 each (70 mg).  Beans,  green,  cup (90 mg).  Beans, yellow,  cup (190 mg).  Beer, regular, 12 oz (100 mg).  Beets, canned,  cup (125 mg).  Blackberries,  cup (115 mg).  Blueberries,  cup (60 mg).  Bread, whole wheat, 1 slice (70 mg).  Broccoli, raw,  cup (145 mg).  Cabbage,  cup (150 mg).  Carrots, cooked or raw,  cup (  180 mg).  Cauliflower, raw,  cup (150 mg).  Celery, raw,  cup (155 mg).  Cereal, bran flakes, cup (120-150 mg).  Cheese, cottage,  cup (110 mg).  Cherries, 10 each (150 mg).  Chocolate, 1 oz bar (165 mg).  Coffee, brewed 6 oz (90 mg).  Corn,  cup or 1 ear (195 mg).  Cucumbers,  cup (80 mg).  Egg, large, 1 each (60 mg).  Eggplant,  cup (60 mg).  Endive, raw, cup (80 mg).  English muffin, 1 each (65 mg).  Fish, orange roughy, 3 oz (150 mg).  Frankfurter, beef or pork, 1 each (75 mg).  Fruit cocktail,  cup (115 mg).  Grape juice,  cup (170 mg).  Grapefruit,  fruit (175 mg).  Grapes,  cup (155 mg).  Greens: kale, turnip, collard,  cup (110-150 mg).  Ice cream or frozen yogurt, chocolate,  cup (175 mg).  Ice cream or frozen yogurt, vanilla,  cup (120-150 mg).  Lemons, limes, 1 each (80 mg).  Lettuce, all types, 1 cup (100 mg).  Mixed vegetables,  cup (150 mg).  Mushrooms, raw,  cup (110 mg).  Nuts: walnuts, pecans, or macadamia, 1 oz (125 mg).  Oatmeal,  cup (80 mg).  Okra,  cup (110 mg).  Onions, raw,  cup (120 mg).  Peach, 1 each (185 mg).  Peaches, canned,  cup (120 mg).  Pears, canned,  cup (120 mg).  Peas, green, frozen,  cup (90 mg).  Peppers, green,  cup (130 mg).  Peppers, red,  cup (160 mg).  Pineapple juice,  cup (165 mg).  Pineapple, fresh or canned,  cup (100 mg).  Plums, 1 each (105 mg).  Pudding, vanilla,  cup (150 mg).  Raspberries,  cup (90 mg).  Rhubarb,  cup (115 mg).  Rice, wild,  cup (80 mg).  Shrimp, 3 oz (155 mg).  Spinach, raw, 1 cup (170 mg).  Strawberries,   cup (125 mg).  Summer squash  cup (175-200 mg).  Swiss chard, raw, 1 cup (135 mg).  Tangerines, 1 each (140 mg).  Tea, brewed, 6 oz (65 mg).  Turnips,  cup (140 mg).  Watermelon,  cup (85 mg).  Wine, red, table, 5 oz (180 mg).  Wine, white, table, 5 oz (100 mg). LOW IN POTASSIUM The following foods and beverages have less than 50 mg of potassium per serving.  Bread, white, 1 slice (30 mg).  Carbonated beverages, 12 oz (less than 5 mg).  Cheese, 1 oz (20-30 mg).  Cranberries,  cup (45 mg).  Cranberry juice cocktail,  cup (20 mg).  Fats and oils, 1 Tbsp (less than 5 mg).  Hummus, 1 Tbsp (32 mg).  Nectar: papaya, mango, or pear,  cup (35 mg).  Rice, white or brown,  cup (50 mg).  Spaghetti or macaroni,  cup cooked (30 mg).  Tortilla, flour or corn, 1 each (50 mg).  Waffle, 4 in., 1 each (50 mg).  Water chestnuts,  cup (40 mg).   This information is not intended to replace advice given to you by your health care provider. Make sure you discuss any questions you have with your health care provider.   Document Released: 02/19/2005 Document Revised: 07/13/2013 Document Reviewed: 06/04/2013 Elsevier Interactive Patient Education [employer]

## 2016-01-23 NOTE — ED Provider Notes (Signed)
[facility] Emergency Department Provider Note   ____________________________________________  Time seen: Approximately 1:07 AM  I have reviewed the triage vital signs and the nursing notes.   HISTORY  Chief Complaint Leg Pain    HPI Molly Rivera is a 55 y.o. female who presents to the ED from home with a chief complaint of left leg pain. Patient reports a 3-4 months history of pain behind her left knee which radiates to her ankle. Has not yet sought medical evaluation for same. States she fell several months prior to onset of her pain. States 1 month prior to the onset of her pain she was placed on Eliquis for atrial fibrillation. She feels the Eliquis was responsible for burning sensation in her opposite, right thigh which was ultimately relieved with hydrocortisone cream. Patient presents to the ED tonight secondary to throbbing pain and she was unable to sleep. 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 Denies associated fever, chills, chest pain, shortness of breath, abdominal pain, nausea, vomiting, diarrhea. Denies recent travel or trauma. Denies hormone use. Nothing makes her pain better. Movement and ambulation makes her pain worse.   Past Medical History  Diagnosis Date  . Allergy   . Arthritis   . Hypertension   . Sleep apnea   . Asthma   . A-fib [facility])     a. new onset 05/2015, b. CHADS2VASc => 2 (HTN and sex category); c. echo 05/2015: EF 50-55%, nl wall motion, mild to mod MR, PASP 38 mm Hg  . Morbid obesity (HCC)   . Diverticulitis   . Mitral regurgitation     a. echo 05/2015: mild to mod MR    Patient Active Problem List   Diagnosis Date Noted  . Obesity 06/14/2015  . Obstructive sleep apnea 06/14/2015  . Asthma 06/14/2015  . Essential hypertension 06/14/2015  . Essential (primary) hypertension 06/14/2015  . Airway hyperreactivity 06/14/2015  . Atrial fibrillation with RVR (HCC) 06/13/2015  . A-fib (HCC)  06/12/2015  . Atrial fibrillation (HCC) 06/12/2015    Past Surgical History  Procedure Laterality Date  . Knee surgery    . Abdominal hysterectomy    . Heel spur excision Left   . Cesarean section    . Tee without cardioversion N/A 06/14/2015    Procedure: TRANSESOPHAGEAL ECHOCARDIOGRAM (TEE);  Surgeon: Vesta Mixer, MD;  Location: [facility] ORS;  Service: Cardiovascular;  Laterality: N/A;  . Electrophysiologic study N/A 06/14/2015    Procedure: CARDIOVERSION;  Surgeon: Vesta Mixer, MD;  Location: [facility] ORS;  Service: Cardiovascular;  Laterality: N/A;    Current Outpatient Rx  Name  Route  Sig  Dispense  Refill  . albuterol (PROVENTIL HFA;VENTOLIN HFA) 108 (90 Base) MCG/ACT inhaler   Inhalation   Inhale 2 puffs into the lungs every 6 (six) hours as needed for wheezing or shortness of breath.   1 Inhaler   2   . apixaban (ELIQUIS) 5 MG TABS tablet   Oral   Take 1 tablet (5 mg total) by mouth 2 (two) times daily.   60 tablet   5   . diltiazem (CARDIZEM CD) 120 MG 24 hr capsule   Oral   Take 1 capsule (120 mg total) by mouth daily.   30 capsule   3   . Furosemide (LASIX PO)   Oral   Take by mouth.         . furosemide (LASIX) 40 MG tablet   Oral  Take 40 mg by mouth.         . methylPREDNISolone (MEDROL) 4 MG TBPK tablet      Tapering 6 day dose pack   21 tablet   0   . metoprolol (LOPRESSOR) 50 MG tablet   Oral   Take 1 tablet (50 mg total) by mouth 2 (two) times daily.   60 tablet   5   . potassium chloride SA (K-DUR,KLOR-CON) 20 MEQ tablet   Oral   Take 20 mEq by mouth daily.           Allergies Allegra; Morphine and related; Other; and Claritin  Family History  Problem Relation Age of Onset  . Adopted: Yes  . Hypertension Mother   . Diabetes Mother     possible  . Schizophrenia Mother   . Healthy Son     Social History Social History  Substance Use Topics  . Smoking status: Never Smoker   . Smokeless tobacco: Never Used  .  Alcohol Use: 0.0 oz/week    0 Standard drinks or equivalent per week     Comment: occasional beer    Review of Systems  Constitutional: No fever/chills. Eyes: No visual changes. ENT: No sore throat. Cardiovascular: Denies chest pain. Respiratory: Denies shortness of breath. Gastrointestinal: No abdominal pain.  No nausea, no vomiting.  No diarrhea.  No constipation. Genitourinary: Negative for dysuria. Musculoskeletal: Positive for left lower leg pain. Negative for back pain. Skin: Negative for rash. Neurological: Negative for headaches, focal weakness or numbness.  10-point ROS otherwise negative.  ____________________________________________   PHYSICAL EXAM:  VITAL SIGNS: ED Triage Vitals  Enc Vitals Group     BP 01/22/16 2328 168/98 mmHg     Pulse Rate 01/22/16 2328 88     Resp 01/22/16 2328 20     Temp 01/22/16 2328 98.1 F (36.7 C)     Temp Source 01/22/16 2328 Oral     SpO2 01/22/16 2328 100 %     Weight 01/22/16 2328 300 lb (136.079 kg)     Height 01/22/16 2328 5\' 2"  (1.575 m)     Head Cir --      Peak Flow --      Pain Score 01/22/16 2329 10     Pain Loc --      Pain Edu? --      Excl. in GC? --     Constitutional: Alert and oriented. Well appearing and in no acute distress. Eyes: Conjunctivae are normal. PERRL. EOMI. Head: Atraumatic. Nose: No congestion/rhinnorhea. Mouth/Throat: Mucous membranes are moist.  Oropharynx non-erythematous. Neck: No stridor.   Cardiovascular: Normal rate, regular rhythm. Grossly normal heart sounds.  Good peripheral circulation. Respiratory: Normal respiratory effort.  No retractions. Lungs CTAB. Gastrointestinal: Obese. Soft and nontender. No distention. No abdominal bruits. No CVA tenderness. Musculoskeletal: Both calves symmetrical at 51.5cm.  Left posterior knee tender to palpation. Calf is supple and not tender to palpation. 2+ femoral, popliteal and distal pulses. Limb is symmetrically warm without evidence for  ischemia. Brisk, less than 5 second capillary refill. No joint effusions. Neurologic:  Normal speech and language. No gross focal neurologic deficits are appreciated.  Skin:  Skin is warm, dry and intact. No rash noted. Psychiatric: Mood and affect are normal. Speech and behavior are normal.  ____________________________________________   LABS (all labs ordered are listed, but only abnormal results are displayed)  Labs Reviewed  BASIC METABOLIC PANEL - Abnormal; Notable for the following:    Potassium 3.0 (*)  Glucose, Bld 113 (*)    All other components within normal limits  CK  MAGNESIUM   ____________________________________________  EKG  None ____________________________________________  RADIOLOGY  Doppler ultrasound interpreted per Dr. Manus GunningEhinger: No evidence of left lower extremity deep venous thrombosis. ____________________________________________   PROCEDURES  Procedure(s) performed: None  Procedures  Critical Care performed: No  ____________________________________________   INITIAL IMPRESSION / ASSESSMENT AND PLAN / ED COURSE  Pertinent labs & imaging results that were available during my care of the patient were reviewed by me and considered in my medical decision making (see chart for details).  55 year old female who presents to the ED for evaluation of a several month history of nontraumatic left posterior leg pain. Doppler ultrasound is negative for DVT. Will check electrolytes, CK and administer NSAIDs for symptomatic pain relief.  ----------------------------------------- 3:01 AM on 01/23/2016 -----------------------------------------  Patient improved. Updated her of laboratory results remarkable for mild hypokalemia. Encouraged increasing potassium via dietary routes. Will write limited prescription for NSAIDs and patient will follow-up with orthopedics. Strict return precautions given. Patient verbalizes understanding and agrees with plan of  care. ____________________________________________   FINAL CLINICAL IMPRESSION(S) / ED DIAGNOSES  Final diagnoses:  Hypokalemia  Pain of left lower extremity      NEW MEDICATIONS STARTED DURING THIS VISIT:  New Prescriptions   No medications on file     Note:  This document was prepared using Dragon voice recognition software and may include unintentional dictation errors.    Irean HongJade J Florene Brill, MD 01/23/16 [phone]

## 2016-03-25 DIAGNOSIS — J45909 Unspecified asthma, uncomplicated: Secondary | ICD-10-CM | POA: Diagnosis not present

## 2016-03-25 DIAGNOSIS — Y990 Civilian activity done for income or pay: Secondary | ICD-10-CM | POA: Insufficient documentation

## 2016-03-25 DIAGNOSIS — W182XXA Fall in (into) shower or empty bathtub, initial encounter: Secondary | ICD-10-CM | POA: Diagnosis not present

## 2016-03-25 DIAGNOSIS — Z79899 Other long term (current) drug therapy: Secondary | ICD-10-CM | POA: Diagnosis not present

## 2016-03-25 DIAGNOSIS — I10 Essential (primary) hypertension: Secondary | ICD-10-CM | POA: Diagnosis not present

## 2016-03-25 DIAGNOSIS — M545 Low back pain: Secondary | ICD-10-CM | POA: Diagnosis present

## 2016-03-25 DIAGNOSIS — M5441 Lumbago with sciatica, right side: Secondary | ICD-10-CM | POA: Diagnosis not present

## 2016-03-25 DIAGNOSIS — R0602 Shortness of breath: Secondary | ICD-10-CM | POA: Insufficient documentation

## 2016-03-25 DIAGNOSIS — Y9389 Activity, other specified: Secondary | ICD-10-CM | POA: Insufficient documentation

## 2016-03-25 DIAGNOSIS — Y929 Unspecified place or not applicable: Secondary | ICD-10-CM | POA: Diagnosis not present

## 2016-03-26 ENCOUNTER — Emergency Department
Admission: EM | Admit: 2016-03-26 | Discharge: 2016-03-26 | Disposition: A | Discharge status: Home / Self Care | Payer: Worker's Compensation | Attending: Emergency Medicine | Admitting: Emergency Medicine

## 2016-03-26 ENCOUNTER — Emergency Department: Payer: Worker's Compensation

## 2016-03-26 DIAGNOSIS — M545 Low back pain, unspecified: Secondary | ICD-10-CM

## 2016-03-26 DIAGNOSIS — W19XXXA Unspecified fall, initial encounter: Secondary | ICD-10-CM

## 2016-03-26 DIAGNOSIS — M5431 Sciatica, right side: Secondary | ICD-10-CM

## 2016-03-26 MED ORDER — LIDOCAINE 5 % EX PTCH: 1.0000 | MEDICATED_PATCH | Freq: Two times a day (BID) | CUTANEOUS | 0 refills | Status: AC

## 2016-03-26 MED ORDER — TRAMADOL HCL 50 MG PO TABS
50.0000 mg | ORAL_TABLET | Freq: Once | ORAL | Status: AC
  Administered 2016-03-26: 50 mg via ORAL
  Filled 2016-03-26: qty 1

## 2016-03-26 MED ORDER — TRAMADOL HCL 50 MG PO TABS: 50.0000 mg | ORAL_TABLET | Freq: Four times a day (QID) | ORAL | 0 refills | Status: DC | PRN

## 2016-03-26 MED ORDER — DIAZEPAM 2 MG PO TABS: 2.0000 mg | ORAL_TABLET | Freq: Three times a day (TID) | ORAL | 0 refills | Status: AC | PRN

## 2016-03-26 MED ORDER — LIDOCAINE 5 % EX PTCH
1.0000 | MEDICATED_PATCH | CUTANEOUS | Status: DC
  Administered 2016-03-26: 1 via TRANSDERMAL
  Filled 2016-03-26: qty 1

## 2016-03-26 MED ORDER — DIAZEPAM 5 MG PO TABS
5.0000 mg | ORAL_TABLET | Freq: Once | ORAL | Status: AC
  Administered 2016-03-26: 5 mg via ORAL
  Filled 2016-03-26: qty 1

## 2016-03-26 NOTE — ED Provider Notes (Signed)
[facility] Emergency Department Provider Note   ____________________________________________   First MD Initiated Contact with Patient 03/26/16 0109     (approximate)  I have reviewed the triage vital signs and the nursing notes.   HISTORY  Chief Complaint Fall    HPI Molly Blalockatricia Rivera is a 55 y.o. female who comes into the hospital today after a fall. She reports that she was at work getting a shower and she turned to get a towel. She reports that when she did that she slipped in both of her legs went up in the air. The patient fell on her tailbone. She reports that she went backwards but did not hit her head. She reports that she's had some shortness of breath but reports that this is not uncommon for her. She has pain in her mid back all the way down to her tailbone and then going down her right leg. The patient took 2 Aleve but reports that it has not helped. The incident occurred at 8:30. The patient rates her pain an 8 out of 10 in intensity. The patient has some nausea with no abdominal pain and no chest pain. The patient is here for treatment and evaluation of her pain.   Past Medical History:  Diagnosis Date  . A-fib [facility](HCC)    a. new onset 05/2015, b. CHADS2VASc => 2 (HTN and sex category); c. echo 05/2015: EF 50-55%, nl wall motion, mild to mod MR, PASP 38 mm Hg  . Allergy   . Arthritis   . Asthma   . Diverticulitis   . Hypertension   . Mitral regurgitation    a. echo 05/2015: mild to mod MR  . Morbid obesity (HCC)   . Sleep apnea     Patient Active Problem List   Diagnosis Date Noted  . Obesity 06/14/2015  . Obstructive sleep apnea 06/14/2015  . Asthma 06/14/2015  . Essential hypertension 06/14/2015  . Essential (primary) hypertension 06/14/2015  . Airway hyperreactivity 06/14/2015  . Atrial fibrillation with RVR (HCC) 06/13/2015  . A-fib (HCC) 06/12/2015  . Atrial fibrillation (HCC) 06/12/2015    Past Surgical History:    Procedure Laterality Date  . ABDOMINAL HYSTERECTOMY    . CESAREAN SECTION    . ELECTROPHYSIOLOGIC STUDY N/A 06/14/2015   Procedure: CARDIOVERSION;  Surgeon: Vesta MixerPhilip J Nahser, MD;  Location: [facility] ORS;  Service: Cardiovascular;  Laterality: N/A;  . HEEL SPUR EXCISION Left   . knee surgery    . TEE WITHOUT CARDIOVERSION N/A 06/14/2015   Procedure: TRANSESOPHAGEAL ECHOCARDIOGRAM (TEE);  Surgeon: Vesta MixerPhilip J Nahser, MD;  Location: [facility] ORS;  Service: Cardiovascular;  Laterality: N/A;    Prior to Admission medications   Medication Sig Start Date End Date Taking? Authorizing Provider  albuterol (PROVENTIL HFA;VENTOLIN HFA) 108 (90 Base) MCG/ACT inhaler Inhale 2 puffs into the lungs every 6 (six) hours as needed for wheezing or shortness of breath. 10/11/15   Tommi Rumpshonda L Summers, PA-C  apixaban (ELIQUIS) 5 MG TABS tablet Take 1 tablet (5 mg total) by mouth 2 (two) times daily. 06/14/15   Katharina Caperima Vaickute, MD  diazepam (VALIUM) 2 MG tablet Take 1 tablet (2 mg total) by mouth every 8 (eight) hours as needed for anxiety. 03/26/16 03/26/17  Rebecka ApleyAllison P Kuuipo Anzaldo, MD  diltiazem (CARDIZEM CD) 120 MG 24 hr capsule Take 1 capsule (120 mg total) by mouth daily. 06/14/15   Katharina Caperima Vaickute, MD  Furosemide (LASIX PO) Take by mouth.    Historical Provider, MD  furosemide (LASIX)  40 MG tablet Take 40 mg by mouth.    Historical Provider, MD  lidocaine (LIDODERM) 5 % Place 1 patch onto the skin every 12 (twelve) hours. Remove & Discard patch within 12 hours or as directed by MD 03/26/16 03/26/17  Rebecka Apley, MD  methylPREDNISolone (MEDROL) 4 MG TBPK tablet Tapering 6 day dose pack 06/28/15   Max T Hyatt, DPM  metoprolol (LOPRESSOR) 50 MG tablet Take 1 tablet (50 mg total) by mouth 2 (two) times daily. 06/14/15   Katharina Caper, MD  naproxen (NAPROSYN) 500 MG tablet Take 1 tablet (500 mg total) by mouth 2 (two) times daily with a meal. 01/23/16   Irean Hong, MD  potassium chloride SA (K-DUR,KLOR-CON) 20 MEQ tablet Take 20 mEq by mouth  daily.    Historical Provider, MD  traMADol (ULTRAM) 50 MG tablet Take 1 tablet (50 mg total) by mouth every 6 (six) hours as needed. 03/26/16   Rebecka Apley, MD    Allergies Allegra [fexofenadine]; Morphine and related; Other; and Claritin [loratadine]  Family History  Problem Relation Age of Onset  . Adopted: Yes  . Hypertension Mother   . Diabetes Mother     possible  . Schizophrenia Mother   . Healthy Son     Social History Social History  Substance Use Topics  . Smoking status: Never Smoker  . Smokeless tobacco: Never Used  . Alcohol use 0.0 oz/week     Comment: occasional beer    Review of Systems Constitutional: No fever/chills Eyes: No visual changes. ENT: No sore throat. Cardiovascular: Denies chest pain. Respiratory:  shortness of breath. Gastrointestinal: nausea with No abdominal pain. no vomiting.  No diarrhea.  No constipation. Genitourinary: Negative for dysuria. Musculoskeletal:  back pain. Skin: Negative for rash. Neurological: Negative for headaches, focal weakness or numbness.  10-point ROS otherwise negative.  ____________________________________________   PHYSICAL EXAM:  VITAL SIGNS: ED Triage Vitals  Enc Vitals Group     BP 03/26/16 0009 (!) 194/100     Pulse Rate 03/26/16 0009 74     Resp 03/26/16 0009 20     Temp 03/26/16 0009 98.2 F (36.8 C)     Temp Source 03/26/16 0009 Oral     SpO2 03/26/16 0009 99 %     Weight 03/26/16 0007 (!) 323 lb (146.5 kg)     Height 03/26/16 0007 5\' 2"  (1.575 m)     Head Circumference --      Peak Flow --      Pain Score 03/26/16 0007 8     Pain Loc --      Pain Edu? --      Excl. in GC? --     Constitutional: Alert and oriented. Well appearing and in moderate distress. Eyes: Conjunctivae are normal. PERRL. EOMI. Head: Atraumatic. Nose: No congestion/rhinnorhea. Neck: No cervical spine tenderness to palpation Mouth/Throat: Mucous membranes are moist.  Oropharynx  non-erythematous. Cardiovascular: Normal rate, regular rhythm. Grossly normal heart sounds.  Good peripheral circulation. Respiratory: Normal respiratory effort.  No retractions. Lungs CTAB. Gastrointestinal: Soft and nontender. No distention. Positive bowel sounds Musculoskeletal: negative straight leg raise, tenderness to palpation along the lumbar spine.  Neurologic:  Normal speech and language.  Skin:  Skin is warm, dry and intact.  Psychiatric: Mood and affect are normal.   ____________________________________________   LABS (all labs ordered are listed, but only abnormal results are displayed)  Labs Reviewed - No data to display ____________________________________________  EKG  none  ____________________________________________  RADIOLOGY  Lumbar spine xray ____________________________________________   PROCEDURES  Procedure(s) performed: None  Procedures  Critical Care performed: No  ____________________________________________   INITIAL IMPRESSION / ASSESSMENT AND PLAN / ED COURSE  Pertinent labs & imaging results that were available during my care of the patient were reviewed by me and considered in my medical decision making (see chart for details).  This is a 55 year old female who comes into the hospital today after a fall at work. The patient is having some lumbar spine pain as well as some pain shooting down her leg. I will send the patient for an x-ray, give her a Lidoderm patch and a dose of Valium. The patient will be reassessed once I received the results of her imaging studies.  Clinical Course  Value Comment By Time  DG Lumbar Spine 2-3 Views No evidence of fracture or subluxation along the lumbar spine. Rebecka Apley, MD 09/05 0330   The patient's x-ray is unremarkable. Her pain is improved. She'll be discharged home to follow-up with her primary care physician or return if the pain is  worsening.  ____________________________________________   FINAL CLINICAL IMPRESSION(S) / ED DIAGNOSES  Final diagnoses:  Fall, initial encounter  Midline low back pain without sciatica  Sciatica of right side      NEW MEDICATIONS STARTED DURING THIS VISIT:  New Prescriptions   DIAZEPAM (VALIUM) 2 MG TABLET    Take 1 tablet (2 mg total) by mouth every 8 (eight) hours as needed for anxiety.   LIDOCAINE (LIDODERM) 5 %    Place 1 patch onto the skin every 12 (twelve) hours. Remove & Discard patch within 12 hours or as directed by MD   TRAMADOL (ULTRAM) 50 MG TABLET    Take 1 tablet (50 mg total) by mouth every 6 (six) hours as needed.     Note:  This document was prepared using Dragon voice recognition software and may include unintentional dictation errors.    Rebecka Apley, MD 03/26/16 [phone]

## 2016-03-26 NOTE — ED Notes (Signed)
Pt c/o shooting pain to the back of R leg.  EDP notfied.  No change to plan of care at this time.

## 2016-03-26 NOTE — ED Notes (Signed)
Pt discharged to home.  Family member driving.  Discharge instructions reviewed.  Verbalized understanding.  No questions or concerns at this time.  Teach back verified.  Pt in NAD.  No items left in ED.   

## 2016-03-26 NOTE — ED Notes (Signed)
Pt workers comp was preformed in triage. Pt was compliant with all of the W/C guidelines. Pt urine specimen was within normal temperature limits. Pt verified understanding of form. Pt verbalized that she did not have any further questions regarding the urine sample

## 2016-03-26 NOTE — ED Triage Notes (Addendum)
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

## 2016-08-07 ENCOUNTER — Ambulatory Visit: Payer: No Typology Code available for payment source | Admitting: Podiatry

## 2016-08-21 ENCOUNTER — Ambulatory Visit: Payer: Commercial Managed Care - PPO

## 2016-08-21 ENCOUNTER — Encounter (INDEPENDENT_AMBULATORY_CARE_PROVIDER_SITE_OTHER): Payer: Commercial Managed Care - PPO | Admitting: Podiatry

## 2016-08-21 DIAGNOSIS — M722 Plantar fascial fibromatosis: Secondary | ICD-10-CM

## 2016-08-21 NOTE — Progress Notes (Signed)
This encounter was created in error - please disregard.

## 2016-11-11 ENCOUNTER — Other Ambulatory Visit: Payer: Self-pay | Admitting: Emergency Medicine

## 2016-11-17 ENCOUNTER — Emergency Department: Payer: Commercial Managed Care - PPO

## 2016-11-17 DIAGNOSIS — Z79899 Other long term (current) drug therapy: Secondary | ICD-10-CM | POA: Insufficient documentation

## 2016-11-17 DIAGNOSIS — R0602 Shortness of breath: Secondary | ICD-10-CM | POA: Diagnosis present

## 2016-11-17 DIAGNOSIS — I10 Essential (primary) hypertension: Secondary | ICD-10-CM | POA: Diagnosis not present

## 2016-11-17 DIAGNOSIS — J181 Lobar pneumonia, unspecified organism: Secondary | ICD-10-CM | POA: Diagnosis not present

## 2016-11-17 DIAGNOSIS — J45909 Unspecified asthma, uncomplicated: Secondary | ICD-10-CM | POA: Insufficient documentation

## 2016-11-17 NOTE — ED Triage Notes (Signed)
Reports having "allergies" for awhile.  Using OTC medications without relief.  Reports short of breath and "winded".  Reports symptoms for 3-4 days.

## 2016-11-18 ENCOUNTER — Emergency Department
Admission: EM | Admit: 2016-11-18 | Discharge: 2016-11-18 | Disposition: A | Discharge status: Home / Self Care | Payer: Commercial Managed Care - PPO | Attending: Emergency Medicine | Admitting: Emergency Medicine

## 2016-11-18 DIAGNOSIS — R0602 Shortness of breath: Secondary | ICD-10-CM

## 2016-11-18 DIAGNOSIS — J189 Pneumonia, unspecified organism: Secondary | ICD-10-CM

## 2016-11-18 DIAGNOSIS — J181 Lobar pneumonia, unspecified organism: Secondary | ICD-10-CM

## 2016-11-18 MED ORDER — DOXYCYCLINE HYCLATE 100 MG PO CAPS: ORAL_CAPSULE | ORAL | 0 refills | Status: DC

## 2016-11-18 MED ORDER — IPRATROPIUM-ALBUTEROL 0.5-2.5 (3) MG/3ML IN SOLN
3.0000 mL | Freq: Once | RESPIRATORY_TRACT | Status: AC
  Administered 2016-11-18: 3 mL via RESPIRATORY_TRACT
  Filled 2016-11-18: qty 3

## 2016-11-18 MED ORDER — DOXYCYCLINE HYCLATE 100 MG PO TABS
100.0000 mg | ORAL_TABLET | Freq: Once | ORAL | Status: AC
  Administered 2016-11-18: 100 mg via ORAL
  Filled 2016-11-18: qty 1

## 2016-11-18 NOTE — ED Provider Notes (Signed)
[facility] Emergency Department Provider Note  ____________________________________________   First MD Initiated Contact with Patient 11/18/16 [phone]     (approximate)  I have reviewed the triage vital signs and the nursing notes.   HISTORY  Chief Complaint No chief complaint on file.    HPI Paislie Tessler is a 56 y.o. female with extensive PMH who presents for evaluation of shortness of breath.  She states that it has been gradual in onset over the last 2 weeks and is moderate in intensity with exertion and mild at baseline.  She suffers from seasonal allergies and her allergies have been severe this year.  She is using cetirizine and Flonase but it is only helping a little bit.  She notes that her shortness of breath seems to have gotten worse over the last few days with a frequent cough that is occasionally productive of sputum.    She denies fever/chills, chest pain, nausea, vomiting, abdominal pain, dysuria.  Past Medical History:  Diagnosis Date  . A-fib [facility])    a. new onset 05/2015, b. CHADS2VASc => 2 (HTN and sex category); c. echo 05/2015: EF 50-55%, nl wall motion, mild to mod MR, PASP 38 mm Hg  . Allergy   . Arthritis   . Asthma   . Diverticulitis   . Hypertension   . Mitral regurgitation    a. echo 05/2015: mild to mod MR  . Morbid obesity (HCC)   . Sleep apnea     Patient Active Problem List   Diagnosis Date Noted  . Obesity 06/14/2015  . Obstructive sleep apnea 06/14/2015  . Asthma 06/14/2015  . Essential hypertension 06/14/2015  . Essential (primary) hypertension 06/14/2015  . Airway hyperreactivity 06/14/2015  . Atrial fibrillation with RVR (HCC) 06/13/2015  . A-fib (HCC) 06/12/2015  . Atrial fibrillation (HCC) 06/12/2015    Past Surgical History:  Procedure Laterality Date  . ABDOMINAL HYSTERECTOMY    . CESAREAN SECTION    . ELECTROPHYSIOLOGIC STUDY N/A 06/14/2015   Procedure: CARDIOVERSION;  Surgeon: Vesta Mixer, MD;  Location: [facility] ORS;  Service: Cardiovascular;  Laterality: N/A;  . HEEL SPUR EXCISION Left   . knee surgery    . TEE WITHOUT CARDIOVERSION N/A 06/14/2015   Procedure: TRANSESOPHAGEAL ECHOCARDIOGRAM (TEE);  Surgeon: Vesta Mixer, MD;  Location: [facility] ORS;  Service: Cardiovascular;  Laterality: N/A;    Prior to Admission medications   Medication Sig Start Date End Date Taking? Authorizing Provider  albuterol (PROVENTIL HFA;VENTOLIN HFA) 108 (90 Base) MCG/ACT inhaler Inhale 2 puffs into the lungs every 6 (six) hours as needed for wheezing or shortness of breath. 10/11/15   Tommi Rumps, PA-C  apixaban (ELIQUIS) 5 MG TABS tablet Take 1 tablet (5 mg total) by mouth 2 (two) times daily. 06/14/15   Katharina Caper, MD  diazepam (VALIUM) 2 MG tablet Take 1 tablet (2 mg total) by mouth every 8 (eight) hours as needed for anxiety. 03/26/16 03/26/17  Rebecka Apley, MD  diltiazem (CARDIZEM CD) 120 MG 24 hr capsule Take 1 capsule (120 mg total) by mouth daily. 06/14/15   Katharina Caper, MD  doxycycline (VIBRAMYCIN) 100 MG capsule Take 1 capsule (100 mg) by mouth twice daily for 10 days. 11/18/16   Loleta Rose, MD  Furosemide (LASIX PO) Take by mouth.    Historical Provider, MD  furosemide (LASIX) 40 MG tablet Take 40 mg by mouth.    Historical Provider, MD  lidocaine (LIDODERM) 5 % Place 1 patch onto  the skin every 12 (twelve) hours. Remove & Discard patch within 12 hours or as directed by MD 03/26/16 03/26/17  Rebecka Apley, MD  metoprolol (LOPRESSOR) 50 MG tablet Take 1 tablet (50 mg total) by mouth 2 (two) times daily. 06/14/15   Katharina Caper, MD  naproxen (NAPROSYN) 500 MG tablet Take 1 tablet (500 mg total) by mouth 2 (two) times daily with a meal. 01/23/16   Irean Hong, MD  potassium chloride SA (K-DUR,KLOR-CON) 20 MEQ tablet Take 20 mEq by mouth daily.    Historical Provider, MD  traMADol (ULTRAM) 50 MG tablet Take 1 tablet (50 mg total) by mouth every 6 (six) hours as needed. 03/26/16    Rebecka Apley, MD    Allergies Allegra [fexofenadine]; Morphine and related; Other; and Claritin [loratadine]  Family History  Problem Relation Age of Onset  . Adopted: Yes  . Hypertension Mother   . Diabetes Mother     possible  . Schizophrenia Mother   . Healthy Son     Social History Social History  Substance Use Topics  . Smoking status: Never Smoker  . Smokeless tobacco: Never Used  . Alcohol use 0.0 oz/week     Comment: occasional beer    Review of Systems Constitutional: No fever/chills Eyes: No visual changes. ENT: No sore throat. Cardiovascular: Denies chest pain. Respiratory: +shortness of breath, +cough (sometimes productive) Gastrointestinal: No abdominal pain.  No nausea, no vomiting.  No diarrhea.  No constipation. Genitourinary: Negative for dysuria. Musculoskeletal: Negative for back pain. Integumentary: Negative for rash. Neurological: Negative for headaches, focal weakness or numbness.   ____________________________________________   PHYSICAL EXAM:  VITAL SIGNS: ED Triage Vitals  Enc Vitals Group     BP 11/17/16 2243 (!) 203/102     Pulse Rate 11/17/16 2243 76     Resp 11/17/16 2243 (!) 22     Temp 11/17/16 2243 98.1 F (36.7 C)     Temp Source 11/17/16 2243 Oral     SpO2 11/17/16 2243 99 %     Weight 11/17/16 2242 (!) 323 lb (146.5 kg)     Height 11/17/16 2242  (1.575 m)     Head Circumference --      Peak Flow --      Pain Score --      Pain Loc --      Pain Edu? --      Excl. in GC? --     Constitutional: Alert and oriented. Well appearing and in no acute distress. Eyes: Conjunctivae are normal. PERRL. EOMI. Head: Atraumatic. Nose: No congestion/rhinnorhea. Mouth/Throat: Mucous membranes are moist. Neck: No stridor.  No meningeal signs.   Cardiovascular: Normal rate, regular rhythm. Good peripheral circulation. Grossly normal heart sounds. Respiratory: Normal respiratory effort.  No retractions. Lungs  CTAB. Gastrointestinal: Morbid obesity. Soft and nontender. No distention.  Musculoskeletal: Trace pitting peripheral edema. No gross deformities of extremities. Neurologic:  Normal speech and language. No gross focal neurologic deficits are appreciated.  Skin:  Skin is warm, dry and intact. No rash noted. Psychiatric: Mood and affect are normal. Speech and behavior are normal.  ____________________________________________   LABS (all labs ordered are listed, but only abnormal results are displayed)  Labs Reviewed - No data to display ____________________________________________  EKG  None - EKG not ordered by ED physician ____________________________________________  RADIOLOGY   Dg Chest 2 View  Result Date: 11/17/2016 CLINICAL DATA:  Allergies, short of breath EXAM: CHEST  2 VIEW COMPARISON:  10/11/2015  FINDINGS: Streaky atelectasis versus mild infiltrate left lung base. No consolidation or effusion. Stable borderline cardiomegaly with mild central congestion. No pneumothorax. IMPRESSION: 1. Borderline to mild cardiomegaly with slight central congestion. 2. Streaky atelectasis or infiltrate at the left lung base. Electronically Signed   By: Jasmine Pang M.D.   On: 11/17/2016 23:53    ____________________________________________   PROCEDURES  Critical Care performed: No   Procedure(s) performed:   Procedures   ____________________________________________   INITIAL IMPRESSION / ASSESSMENT AND PLAN / ED COURSE  Pertinent labs & imaging results that were available during my care of the patient were reviewed by me and considered in my medical decision making (see chart for details).  The patient has multiple comorbidities and is on Eliquis for chronic A. fib, but she denies any chest pain and has only had the gradually worsening shortness of breath with exertion, also in the setting of suffering from seasonal allergies.  In spite of her past medical history, she  actually looks quite well and is in no distress and ambulates without difficulty.  Upon auscultation, her lungs are clear to wheezing although the exam is somewhat limited by her body habitus.  Her vital signs are normal except for (asymptomatic) hypertension and her oxygen saturation has been in the upper 90s to 100%.  We had a long discussion about her chest x-ray and agreed that we would treat the infiltrate seen as community-acquired pneumonia and she can continue using her medications at home including her albuterol.  We discussed obtaining labs but I explained that because I do not feel that this is related to her heart or heart failure (and she agrees because she has not had any recent weight gain or subjective increase in fluid volume), and we both agreed that labs were not necessary at this time.  We will treat her empirically with doxycycline and she already has a follow-up appointment scheduled in 4 days with her primary care doctor.  I gave strict return precautions should she develop new or worsening symptoms.     ____________________________________________  FINAL CLINICAL IMPRESSION(S) / ED DIAGNOSES  Final diagnoses:  Shortness of breath  Community acquired pneumonia of left lower lobe of lung (HCC)     MEDICATIONS GIVEN DURING THIS VISIT:  Medications  doxycycline (VIBRA-TABS) tablet 100 mg (not administered)     NEW OUTPATIENT MEDICATIONS STARTED DURING THIS VISIT:  New Prescriptions   DOXYCYCLINE (VIBRAMYCIN) 100 MG CAPSULE    Take 1 capsule (100 mg) by mouth twice daily for 10 days.    Modified Medications   No medications on file    Discontinued Medications   No medications on file     Note:  This document was prepared using Dragon voice recognition software and may include unintentional dictation errors.    Loleta Rose, MD 11/18/16 [phone]

## 2016-11-18 NOTE — Discharge Instructions (Signed)
Your vital signs and physical exam were reassuring today, but your chest x-ray suggests a very mild community-acquired pneumonia.  We recommend that you continue taking all of your regular medications and addition to the antibiotics will be prescribed today.  Please take the full course of treatment (10 days).  Follow-up with your regular doctor as scheduled in the next 4 days.  Return to the emergency department immediately if he develop any new or worsening symptoms that concern you.

## 2016-11-18 NOTE — ED Notes (Signed)
Pt states that she has been dealing with allergies for the past 2 weeks, states that she has been using her flonase and certrizine without relief, pt reports that she has been wheezing, coughing with occasional productive sputum, pt states that she can only walk a few steps before she becomes sob. Pt has expiratory wheezing noted to her rt upper lobe and diminished breath sounds to bilat lower lobes.

## 2016-11-18 NOTE — ED Notes (Signed)
Pt waiting patiently in the lobby for treatment room; reports pain only with cough; warm blanket provided for comfort

## 2016-11-18 NOTE — ED Notes (Signed)
Blood pressure retaken immediately after high number before discharge.

## 2016-11-21 ENCOUNTER — Other Ambulatory Visit: Payer: Self-pay | Admitting: Emergency Medicine

## 2017-01-20 ENCOUNTER — Ambulatory Visit
Admission: RE | Admit: 2017-01-20 | Discharge: 2017-01-20 | Disposition: A | Discharge status: Home / Self Care | Payer: Self-pay | Source: Ambulatory Visit | Attending: Oncology | Admitting: Oncology

## 2017-01-20 ENCOUNTER — Encounter (INDEPENDENT_AMBULATORY_CARE_PROVIDER_SITE_OTHER): Payer: Self-pay

## 2017-01-20 ENCOUNTER — Ambulatory Visit: Payer: Self-pay | Attending: Oncology | Admitting: *Deleted

## 2017-01-20 ENCOUNTER — Encounter: Payer: Self-pay | Admitting: *Deleted

## 2017-01-20 VITALS — BP 155/93 | HR 76 | Temp 97.2°F | Resp 18 | Ht 64.0 in | Wt 348.0 lb

## 2017-01-20 DIAGNOSIS — Z0000 Encounter for general adult medical examination without abnormal findings: Secondary | ICD-10-CM

## 2017-01-20 NOTE — Progress Notes (Signed)
Subjective:     Patient ID: Orlinda Blalockatricia Gordin, female   DOB: 10-09-1960, 56 y.o.   MRN: [id number]  HPI   Review of Systems     Objective:   Physical Exam  Pulmonary/Chest: Right breast exhibits no inverted nipple, no mass, no nipple discharge, no skin change and no tenderness. Left breast exhibits no inverted nipple, no mass, no nipple discharge, no skin change and no tenderness. Breasts are symmetrical.  Large pendulous breast noted       Assessment:     56 [facility] Black [facility].  Clinical breast exam unremarkable.  Taught self breast awareness.  Patient with a history of total hysterectomy for fibroids and ovarian cyst.  Patient [facility].   There are no mammogram imaging dates in Epic.  Patient states it could be greater than 10 years ago.  No images to review.  Will order screening mammogram.  Encouraged patient to return for annual mammograms.  Patient has been screened for eligibility.  She does not have any insurance, Medicare or Medicaid.  She also meets financial eligibility.  Hand-out given on the Affordable Care Act.     Plan:     Screening mammogram ordered.  Will follow-up per protocol.

## 2017-01-20 NOTE — Patient Instructions (Signed)
Gave patient hand-out, Women Staying Healthy, Active and Well from [facility], with education on breast health, pap smears, heart and colon health. 

## 2017-01-27 ENCOUNTER — Inpatient Hospital Stay
Admission: RE | Admit: 2017-01-27 | Discharge: 2017-01-27 | Disposition: A | Discharge status: Home / Self Care | Payer: Self-pay | Source: Ambulatory Visit | Attending: *Deleted | Admitting: *Deleted

## 2017-01-27 ENCOUNTER — Other Ambulatory Visit: Payer: Self-pay | Admitting: *Deleted

## 2017-01-27 DIAGNOSIS — Z9289 Personal history of other medical treatment: Secondary | ICD-10-CM

## 2017-01-31 ENCOUNTER — Encounter: Payer: Self-pay | Admitting: *Deleted

## 2017-01-31 NOTE — Progress Notes (Signed)
Letter mailed from [facility] to inform patient of her normal mammogram results.  Patient is to follow-up with annual screening in one year.  HSIS to Christy. 

## 2017-03-12 ENCOUNTER — Other Ambulatory Visit: Payer: Self-pay | Admitting: Pediatrics

## 2017-03-12 DIAGNOSIS — I158 Other secondary hypertension: Secondary | ICD-10-CM

## 2017-03-12 DIAGNOSIS — G4730 Sleep apnea, unspecified: Secondary | ICD-10-CM

## 2017-03-12 DIAGNOSIS — J449 Chronic obstructive pulmonary disease, unspecified: Secondary | ICD-10-CM

## 2017-04-01 ENCOUNTER — Ambulatory Visit: Payer: Self-pay | Attending: Pediatrics

## 2017-04-13 ENCOUNTER — Emergency Department: Payer: Self-pay

## 2017-04-13 ENCOUNTER — Inpatient Hospital Stay
Admission: EM | Admit: 2017-04-13 | Discharge: 2017-04-15 | DRG: 309 | Disposition: A | Discharge status: Home / Self Care | Payer: Self-pay | Attending: Internal Medicine | Admitting: Internal Medicine

## 2017-04-13 DIAGNOSIS — G4733 Obstructive sleep apnea (adult) (pediatric): Secondary | ICD-10-CM | POA: Diagnosis present

## 2017-04-13 DIAGNOSIS — I4891 Unspecified atrial fibrillation: Secondary | ICD-10-CM | POA: Diagnosis present

## 2017-04-13 DIAGNOSIS — R0609 Other forms of dyspnea: Secondary | ICD-10-CM

## 2017-04-13 DIAGNOSIS — I481 Persistent atrial fibrillation: Principal | ICD-10-CM | POA: Diagnosis present

## 2017-04-13 DIAGNOSIS — Z79899 Other long term (current) drug therapy: Secondary | ICD-10-CM

## 2017-04-13 DIAGNOSIS — Z6841 Body Mass Index (BMI) 40.0 and over, adult: Secondary | ICD-10-CM

## 2017-04-13 DIAGNOSIS — J449 Chronic obstructive pulmonary disease, unspecified: Secondary | ICD-10-CM | POA: Diagnosis present

## 2017-04-13 DIAGNOSIS — Z8249 Family history of ischemic heart disease and other diseases of the circulatory system: Secondary | ICD-10-CM

## 2017-04-13 DIAGNOSIS — I340 Nonrheumatic mitral (valve) insufficiency: Secondary | ICD-10-CM | POA: Diagnosis present

## 2017-04-13 DIAGNOSIS — E782 Mixed hyperlipidemia: Secondary | ICD-10-CM | POA: Diagnosis present

## 2017-04-13 DIAGNOSIS — Z23 Encounter for immunization: Secondary | ICD-10-CM

## 2017-04-13 DIAGNOSIS — Z791 Long term (current) use of non-steroidal anti-inflammatories (NSAID): Secondary | ICD-10-CM

## 2017-04-13 DIAGNOSIS — I10 Essential (primary) hypertension: Secondary | ICD-10-CM | POA: Diagnosis present

## 2017-04-13 DIAGNOSIS — I272 Pulmonary hypertension, unspecified: Secondary | ICD-10-CM | POA: Diagnosis present

## 2017-04-13 DIAGNOSIS — Z7901 Long term (current) use of anticoagulants: Secondary | ICD-10-CM

## 2017-04-13 DIAGNOSIS — E669 Obesity, unspecified: Secondary | ICD-10-CM | POA: Diagnosis present

## 2017-04-13 DIAGNOSIS — Z888 Allergy status to other drugs, medicaments and biological substances status: Secondary | ICD-10-CM

## 2017-04-13 HISTORY — DX: Chronic obstructive pulmonary disease, unspecified: J44.9

## 2017-04-13 LAB — BASIC METABOLIC PANEL
ANION GAP: 12 (ref 5–15)
BUN: 20 mg/dL (ref 6–20)
CO2: 25 mmol/L (ref 22–32)
Calcium: 9 mg/dL (ref 8.9–10.3)
Chloride: 101 mmol/L (ref 101–111)
Creatinine, Ser: 1.02 mg/dL — ABNORMAL HIGH (ref 0.44–1.00)
GFR calc Af Amer: >60 mL/min (ref >60)
GLUCOSE: 217 mg/dL — AB (ref 65–99)
POTASSIUM: 3.2 mmol/L — AB (ref 3.5–5.1)
SODIUM: 138 mmol/L (ref 135–145)

## 2017-04-13 LAB — GLUCOSE, CAPILLARY: Glucose-Capillary: 107 mg/dL — ABNORMAL HIGH (ref 65–99)

## 2017-04-13 LAB — TSH: TSH: 2.264 u[IU]/mL (ref 0.350–4.500)

## 2017-04-13 LAB — CBC
HEMATOCRIT: 44.1 % (ref 35.0–47.0)
HEMOGLOBIN: 14.8 g/dL (ref 12.0–16.0)
MCH: 28.1 pg (ref 26.0–34.0)
MCHC: 33.5 g/dL (ref 32.0–36.0)
MCV: 83.7 fL (ref 80.0–100.0)
PLATELETS: 203 10*3/uL (ref 150–440)
RBC: 5.27 MIL/uL — ABNORMAL HIGH (ref 3.80–5.20)
RDW: 16.2 % — AB (ref 11.5–14.5)
WBC: 7.2 10*3/uL (ref 3.6–11.0)

## 2017-04-13 LAB — TROPONIN I
TROPONIN I: 0.03 ng/mL — AB (ref <0.03)
TROPONIN I: 0.03 ng/mL — AB (ref <0.03)
Troponin I: 0.03 ng/mL (ref <0.03)

## 2017-04-13 MED ORDER — ALBUTEROL SULFATE (2.5 MG/3ML) 0.083% IN NEBU
2.5000 mg | INHALATION_SOLUTION | Freq: Four times a day (QID) | RESPIRATORY_TRACT | Status: DC | PRN
Start: 2017-04-13 — End: 2017-04-15

## 2017-04-13 MED ORDER — POTASSIUM CHLORIDE 20 MEQ PO PACK
PACK | ORAL | Status: AC
  Filled 2017-04-13: qty 2

## 2017-04-13 MED ORDER — ONDANSETRON HCL 4 MG/2ML IJ SOLN
4.0000 mg | Freq: Four times a day (QID) | INTRAMUSCULAR | Status: DC | PRN
  Administered 2017-04-14: 4 mg via INTRAVENOUS
  Filled 2017-04-13: qty 2

## 2017-04-13 MED ORDER — DILTIAZEM HCL 25 MG/5ML IV SOLN
20.0000 mg | Freq: Once | INTRAVENOUS | Status: AC
  Administered 2017-04-13: 20 mg via INTRAVENOUS
  Filled 2017-04-13: qty 5

## 2017-04-13 MED ORDER — DOCUSATE SODIUM 100 MG PO CAPS
100.0000 mg | ORAL_CAPSULE | Freq: Two times a day (BID) | ORAL | Status: DC
  Administered 2017-04-13 – 2017-04-14 (×3): 100 mg via ORAL
  Filled 2017-04-13 (×4): qty 1

## 2017-04-13 MED ORDER — INFLUENZA VAC SPLIT QUAD 0.5 ML IM SUSY
0.5000 mL | PREFILLED_SYRINGE | INTRAMUSCULAR | Status: AC
  Administered 2017-04-15: 0.5 mL via INTRAMUSCULAR
  Filled 2017-04-13: qty 0.5

## 2017-04-13 MED ORDER — ONDANSETRON HCL 4 MG PO TABS: 4.0000 mg | ORAL_TABLET | Freq: Four times a day (QID) | ORAL | Status: DC | PRN

## 2017-04-13 MED ORDER — FUROSEMIDE 40 MG PO TABS
40.0000 mg | ORAL_TABLET | Freq: Two times a day (BID) | ORAL | Status: DC
  Administered 2017-04-14 – 2017-04-15 (×3): 40 mg via ORAL
  Filled 2017-04-13 (×4): qty 1

## 2017-04-13 MED ORDER — DILTIAZEM HCL 25 MG/5ML IV SOLN
10.0000 mg | Freq: Once | INTRAVENOUS | Status: DC
  Filled 2017-04-13: qty 5

## 2017-04-13 MED ORDER — POTASSIUM CHLORIDE 20 MEQ PO PACK
40.0000 meq | PACK | Freq: Two times a day (BID) | ORAL | Status: DC
  Administered 2017-04-13 – 2017-04-15 (×4): 40 meq via ORAL
  Filled 2017-04-13 (×4): qty 2

## 2017-04-13 MED ORDER — BISACODYL 10 MG RE SUPP: 10.0000 mg | Freq: Every day | RECTAL | Status: DC | PRN

## 2017-04-13 MED ORDER — METOPROLOL TARTRATE 50 MG PO TABS
100.0000 mg | ORAL_TABLET | Freq: Once | ORAL | Status: AC
  Administered 2017-04-13: 100 mg via ORAL
  Filled 2017-04-13: qty 2

## 2017-04-13 MED ORDER — CARVEDILOL 25 MG PO TABS
25.0000 mg | ORAL_TABLET | Freq: Two times a day (BID) | ORAL | Status: DC
  Administered 2017-04-13: 25 mg via ORAL
  Filled 2017-04-13: qty 1

## 2017-04-13 MED ORDER — NAPROXEN 500 MG PO TABS
500.0000 mg | ORAL_TABLET | Freq: Two times a day (BID) | ORAL | Status: DC
  Administered 2017-04-13 – 2017-04-14 (×2): 500 mg via ORAL
  Filled 2017-04-13 (×3): qty 1

## 2017-04-13 MED ORDER — NITROGLYCERIN 0.4 MG SL SUBL: 0.4000 mg | SUBLINGUAL_TABLET | SUBLINGUAL | Status: DC | PRN

## 2017-04-13 MED ORDER — POTASSIUM CHLORIDE IN NACL 20-0.9 MEQ/L-% IV SOLN
INTRAVENOUS | Status: DC
  Administered 2017-04-13 – 2017-04-14 (×2): via INTRAVENOUS
  Filled 2017-04-13 (×3): qty 1000

## 2017-04-13 MED ORDER — LORAZEPAM 2 MG/ML IJ SOLN: 0.5000 mg | INTRAMUSCULAR | Status: DC | PRN

## 2017-04-13 MED ORDER — ACETAMINOPHEN 650 MG RE SUPP: 650.0000 mg | Freq: Four times a day (QID) | RECTAL | Status: DC | PRN

## 2017-04-13 MED ORDER — IPRATROPIUM-ALBUTEROL 0.5-2.5 (3) MG/3ML IN SOLN
3.0000 mL | Freq: Four times a day (QID) | RESPIRATORY_TRACT | Status: DC
  Administered 2017-04-13 – 2017-04-14 (×3): 3 mL via RESPIRATORY_TRACT
  Filled 2017-04-13 (×3): qty 3

## 2017-04-13 MED ORDER — ACETAMINOPHEN 325 MG PO TABS
650.0000 mg | ORAL_TABLET | Freq: Once | ORAL | Status: AC
  Administered 2017-04-13: 650 mg via ORAL
  Filled 2017-04-13: qty 2

## 2017-04-13 MED ORDER — ACETAMINOPHEN 325 MG PO TABS
650.0000 mg | ORAL_TABLET | Freq: Four times a day (QID) | ORAL | Status: DC | PRN
  Administered 2017-04-13 – 2017-04-15 (×3): 650 mg via ORAL
  Filled 2017-04-13 (×3): qty 2

## 2017-04-13 MED ORDER — TRIAMTERENE-HCTZ 37.5-25 MG PO TABS
1.0000 | ORAL_TABLET | Freq: Every day | ORAL | Status: DC
  Administered 2017-04-13 – 2017-04-15 (×3): 1 via ORAL
  Filled 2017-04-13 (×3): qty 1

## 2017-04-13 MED ORDER — INSULIN ASPART 100 UNIT/ML ~~LOC~~ SOLN
0.0000 [IU] | Freq: Three times a day (TID) | SUBCUTANEOUS | Status: DC
  Filled 2017-04-13: qty 1

## 2017-04-13 MED ORDER — APIXABAN 5 MG PO TABS
5.0000 mg | ORAL_TABLET | Freq: Two times a day (BID) | ORAL | Status: DC
  Administered 2017-04-13 – 2017-04-15 (×4): 5 mg via ORAL
  Filled 2017-04-13 (×5): qty 1

## 2017-04-13 MED ORDER — METOPROLOL TARTRATE 50 MG PO TABS
50.0000 mg | ORAL_TABLET | Freq: Four times a day (QID) | ORAL | Status: DC
  Administered 2017-04-13 – 2017-04-14 (×3): 50 mg via ORAL
  Filled 2017-04-13 (×3): qty 1

## 2017-04-13 NOTE — Consult Note (Signed)
[facility] Cardiology Consultation Note  Patient ID: Molly Rivera, MRN: [id number], DOB/AGE: [birth year] 56 y.o. Admit date: 04/13/2017   Date of Consult: 04/13/2017 Primary Physician: Sandrea Hughs, NP Primary Cardiologist:[facility]  Chief Complaint:  Chief Complaint  Patient presents with  . Tachycardia  . Chest Pain   Reason for Consult: atrial fibrillation  HPI: 56 y.o. female with known paroxysmal nonvalvular atrial fibrillation who is had appropriate medication management in the past but has had multiple times where she has had some the different changes and side effects of those medications. She has had some difficulty with a rash with diltiazem as well as Procardia. Additionally she has recently gone from a higher dose of metoprolol at 100 mg twice per day which appears to be helping with heart rate control but did not help with the significant worsening blood pressure in the 170 mm range. Her recent doctor has now decided to change to higher dose carvedilol for better blood pressure control although today it appears that this medication has not worked well in controlling her heart rate. So she appears today with atrial fibrillation with rapid ventricular rate and symptoms of shortness of breath but no evidence of heart failure or chest pain. Her heart rate has been in up and down from 140 bpm. Currently after multiple oral doses of carvedilol and intravenous diltiazem for heart rate is 87 bpm. She still has heart rate up into the 120s with ambulation. She has had a previous echocardiogram showing normal LV systolic function and mild mitral regurgitation with some pulmonary hypertension  Past Medical History:  Diagnosis Date  . A-fib [facility])    a. new onset 05/2015, b. CHADS2VASc => 2 (HTN and sex category); c. echo 05/2015: EF 50-55%, nl wall motion, mild to mod MR, PASP 38 mm Hg  . Allergy   . Arthritis   . Asthma   . COPD (chronic obstructive pulmonary disease) (HCC)   .  Diverticulitis   . Hypertension   . Mitral regurgitation    a. echo 05/2015: mild to mod MR  . Morbid obesity (HCC)   . Sleep apnea       Surgical History:  Past Surgical History:  Procedure Laterality Date  . ABDOMINAL HYSTERECTOMY    . CESAREAN SECTION    . ELECTROPHYSIOLOGIC STUDY N/A 06/14/2015   Procedure: CARDIOVERSION;  Surgeon: Vesta Mixer, MD;  Location: [facility] ORS;  Service: Cardiovascular;  Laterality: N/A;  . HEEL SPUR EXCISION Left   . knee surgery    . TEE WITHOUT CARDIOVERSION N/A 06/14/2015   Procedure: TRANSESOPHAGEAL ECHOCARDIOGRAM (TEE);  Surgeon: Vesta Mixer, MD;  Location: [facility] ORS;  Service: Cardiovascular;  Laterality: N/A;     Home Meds: Prior to Admission medications   Medication Sig Start Date End Date Taking? Authorizing Provider  albuterol (PROVENTIL HFA;VENTOLIN HFA) 108 (90 Base) MCG/ACT inhaler Inhale 2 puffs into the lungs every 6 (six) hours as needed for wheezing or shortness of breath. 10/11/15  Yes Bridget Hartshorn L, PA-C  apixaban (ELIQUIS) 5 MG TABS tablet Take 1 tablet (5 mg total) by mouth 2 (two) times daily. 06/14/15  Yes Katharina Caper, MD  cetirizine (ZYRTEC) 10 MG tablet Take 10 mg by mouth daily. 01/15/17  Yes [provider]  furosemide (LASIX) 40 MG tablet Take 40 mg by mouth 2 (two) times daily.    Yes [provider]  naproxen (NAPROSYN) 500 MG tablet Take 1 tablet (500 mg total) by mouth 2 (two) times daily with  a meal. 01/23/16  Yes Irean Hong, MD  potassium chloride SA (K-DUR,KLOR-CON) 20 MEQ tablet Take 20 mEq by mouth daily.   Yes [provider]  triamterene-hydrochlorothiazide (DYAZIDE) 37.5-25 MG capsule Take 1 capsule by mouth daily. 03/27/17  Yes [provider]  metoprolol (LOPRESSOR) 50 MG tablet Take 1 tablet (50 mg total) by mouth 2 (two) times daily. Patient not taking: Reported on 04/13/2017 06/14/15   Katharina Caper, MD  traMADol (ULTRAM) 50 MG tablet Take 1 tablet (50 mg total) by  mouth every 6 (six) hours as needed. Patient not taking: Reported on 04/13/2017 03/26/16   Rebecka Apley, MD    Inpatient Medications:  . insulin aspart  0-9 Units Subcutaneous TID WC  . ipratropium-albuterol  3 mL Nebulization QID  . metoprolol tartrate  50 mg Oral Q6H  . potassium chloride      . potassium chloride  40 mEq Oral BID   . 0.9 % NaCl with KCl 20 mEq / L      Allergies:  Allergies  Allergen Reactions  . Allegra [Fexofenadine] Other (See Comments)    cough cough  . Diltiazem Hcl Other (See Comments)    Rash, joint pain, itching  . Morphine And Related Nausea Only  . Other Nausea Only  . Claritin [Loratadine] Palpitations    Social History   Social History  . Marital status: Single    Spouse name: N/A  . Number of children: N/A  . Years of education: N/A   Occupational History  . Not on file.   Social History Main Topics  . Smoking status: Never Smoker  . Smokeless tobacco: Never Used  . Alcohol use 0.0 oz/week     Comment: occasional beer  . Drug use: No  . Sexual activity: Yes    Birth control/ protection: Surgical   Other Topics Concern  . Not on file   Social History Narrative   ** Merged History Encounter **         Family History  Problem Relation Age of Onset  . Adopted: Yes  . Hypertension Mother   . Diabetes Mother        possible  . Schizophrenia Mother   . Healthy Son      Review of Systems Positive forShortness of breath Negative for: General:  chills, fever, night sweats or weight changes.  Cardiovascular: PND orthopnea syncope dizziness  Dermatological skin lesions rashes Respiratory: Cough congestion Urologic: Frequent urination urination at night and hematuria Abdominal: negative for nausea, vomiting, diarrhea, bright red blood per rectum, melena, or hematemesis Neurologic: negative for visual changes, and/or hearing changes  All other systems reviewed and are otherwise negative except as noted  above.  Labs:  Recent Labs  04/13/17 0425 04/13/17 0602  TROPONINI 0.03* 0.03*   Lab Results  Component Value Date   WBC 7.2 04/13/2017   HGB 14.8 04/13/2017   HCT 44.1 04/13/2017   MCV 83.7 04/13/2017   PLT 203 04/13/2017    Recent Labs Lab 04/13/17 0425  NA 138  K 3.2*  CL 101  CO2 25  BUN 20  CREATININE 1.02*  CALCIUM 9.0  GLUCOSE 217*   No results found for: CHOL, HDL, LDLCALC, TRIG No results found for: DDIMER  Radiology/Studies:  Dg Chest Portable 1 View  Result Date: 04/13/2017 CLINICAL DATA:  Chest pain and shortness of breath EXAM: PORTABLE CHEST 1 VIEW COMPARISON:  Chest radiograph 11/17/2016 FINDINGS: Unchanged cardiomegaly. No focal airspace consolidation or pulmonary edema. No  pneumothorax or sizable pleural effusion. IMPRESSION: Cardiomegaly without acute cardiopulmonary disease. Electronically Signed   By: Deatra Robinson M.D.   On: 04/13/2017 05:07    [id number] fibrillation with rapid ventricular rate and nonspecific ST and T-wave changes  Weights: Filed Weights   04/13/17 0350  Weight: (!) 158.8 kg (350 lb)     Physical Exam: Blood pressure (!) 145/85, pulse 87, temperature 97.9 F (36.6 C), temperature source Oral, resp. rate (!) 24, weight (!) 158.8 kg (350 lb), SpO2 96 %. Body mass index is 60.08 kg/m. General: Well developed, well nourished, in no acute distress. Head eyes ears nose throat: Normocephalic, atraumatic, sclera non-icteric, no xanthomas, nares are without discharge. No apparent thyromegaly and/or mass  Lungs: Normal respiratory effort.  no wheezes, no rales, no rhonchi.  Heart:Irregular with normal S1 S2. no murmur gallop, no rub, PMI is normal size and placement, carotid upstroke normal without bruit, jugular venous pressure is normal Abdomen: Soft, non-tender, non-distended with normoactive bowel sounds. No hepatomegaly. No rebound/guarding. No obvious abdominal masses. Abdominal aorta is normal size without  bruit Extremities:Trace to 1+ edema. no cyanosis, no clubbing, no ulcers  Peripheral : 2+ bilateral upper extremity pulses, 2+ bilateral femoral pulses, 2+ bilateral dorsal pedal pulse Neuro: Alert and oriented. No facial asymmetry. No focal deficit. Moves all extremities spontaneously. Musculoskeletal: Normal muscle tone without kyphosis Psych:  Responds to questions appropriately with a normal affect.    Assessment: 56 year old female with paroxysmal nonvalvular atrial fibrillation with rapid ventricular rate of essential hypertension mixed hyperlipidemia and sleep apnea needing further medication management  Plan: 1. Reinstatement of metoprolol at 100 mg twice per day which was helping with blood pressure and heart rate in the past and abstain from carvedilol due to concerns that it is not controlling heart rate well enough 2. Further consideration of medication management for hypertension control including angiotensin receptor blocker as well as possible clonidine use 3. No further cardiac intervention at this time due to no evidence of myocardial infarction and/or congestive heart failure 4. Continue anticoagulation for further risk reduction in stroke with atrial fibrillation neck slide 5. Begin ambulation in the morning following for heart rate control and if reasonable heart rate control possible discharge to home with follow-up early next week for further adjustments of medication management  Signed, Lamar Blinks M.D. [facility] Cardiology 04/13/2017, 3:30 PM

## 2017-04-13 NOTE — ED Notes (Signed)
Called pharmacy to request medication 

## 2017-04-13 NOTE — ED Provider Notes (Signed)
[facility]  I accepted care from Dr. Manson Passey ____________________________________________    LABS (pertinent positives/negatives)  I, Governor Rooks, MD have personally reviewed the lab reports noted below.  Labs Reviewed  BASIC METABOLIC PANEL - Abnormal; Notable for the following:       Result Value   Potassium 3.2 (*)    Glucose, Bld 217 (*)    Creatinine, Ser 1.02 (*)    All other components within normal limits  CBC - Abnormal; Notable for the following:    RBC 5.27 (*)    RDW 16.2 (*)    All other components within normal limits  TROPONIN I - Abnormal; Notable for the following:    Troponin I 0.03 (*)    All other components within normal limits  TROPONIN I - Abnormal; Notable for the following:    Troponin I 0.03 (*)    All other components within normal limits     ____________________________________________    RADIOLOGY All xrays were viewed by me. Imaging interpreted by radiologist.  I, Governor Rooks MD have personally reviewed the imaging report noted below.  None  ____________________________________________   PROCEDURES  Procedure(s) performed: None  Critical Care performed: None  ____________________________________________   INITIAL IMPRESSION / ASSESSMENT AND PLAN / ED COURSE   Pertinent labs & imaging results that were available during my care of the patient were reviewed by me and considered in my medical decision making (see chart for details).  Planned to watch 3-4 hours after 2nd dose iv dilt and po carvedolol.  At about 3 hours, nurse let the patient's heart rate had gone back up. Heart rate between 107 and 137.  I spoke with Dr. Gwen Pounds regarding next steps in terms of rate control, IV bolus versus stroke versus admission versus continued. Observation here in the emergency department.  He did recommend trying by mouth metoprolol with additional observation..  I spoke to the patient and she is extremely anxious  about going home, I did discuss with her we would try one more time with by mouth metoprolol and reconsider hospital observation if fails in the next 3-4 hours.  patient's heart rate in the 80s at rest, but is no she gets up and walks becomes dyspneic heart rate goes up into the 140s. Sinus bradycardia with Dr. Gwen Pounds recommend a second by mouth dose of metoprolol titrate 100 mg. Patient is adamant that she cannot go home like this and I do think that after 10 hours of monitoring observation and treatment emergency Department multiple therapies hospital observation is the correct was at this point.  Spoke with Dr. Judithann Sheen for admission.    CONSULTATIONS: Dr. Gwen Pounds -- reviewed case and recommends po metoprolol at prior dose -- feels metoprolol better for rate control than carvedolol.  Try few more hours observation.  Dr. Judithann Sheen, hospitalist for admission.    Patient / Family / Caregiver informed of clinical course, medical decision-making process, and agree with plan.   I discussed return precautions, follow-up instructions, and discharged instructions with patient and/or family.     ____________________________________________   FINAL CLINICAL IMPRESSION(S) / ED DIAGNOSES  Final diagnoses:  Atrial fibrillation with rapid ventricular response (HCC)        Governor Rooks, MD 04/13/17 1446

## 2017-04-13 NOTE — ED Notes (Signed)
Pt walked down the hall and HR went to 125 and RR went to 46.

## 2017-04-13 NOTE — H&P (Signed)
History and Physical    Molly Rivera ZOX:[id number] DOB: [birth year] DOA: 04/13/2017  Referring physician: Dr. Shaune Pollack PCP: Sandrea Hughs, NP  Specialists: none  Chief Complaint: palpitations and SOB  HPI: Molly Rivera is a 56 y.o. female has a past medical history significant for obesity, COPD/asthma, chronic a-fib and HTN who presents to ER with palpitations and DOE. No CP. No fever. Denies N/V/D. In the ER, pt noted to be in a-fib with RVR made worse with ambulation. Associated with tachypnea and dyspnea but no CP. No improvement in sx's despite multiple doses of po meds given in ER. She is now admitted  Review of Systems: The patient denies anorexia, fever, weight loss,, vision loss, decreased hearing, hoarseness, chest pain, syncope, balance deficits, hemoptysis, abdominal pain, melena, hematochezia, severe indigestion/heartburn, hematuria, incontinence, genital sores, muscle weakness, suspicious skin lesions, transient blindness, difficulty walking, depression, unusual weight change, abnormal bleeding, enlarged lymph nodes, angioedema, and breast masses.   Past Medical History:  Diagnosis Date  . A-fib [facility])    a. new onset 05/2015, b. CHADS2VASc => 2 (HTN and sex category); c. echo 05/2015: EF 50-55%, nl wall motion, mild to mod MR, PASP 38 mm Hg  . Allergy   . Arthritis   . Asthma   . COPD (chronic obstructive pulmonary disease) (HCC)   . Diverticulitis   . Hypertension   . Mitral regurgitation    a. echo 05/2015: mild to mod MR  . Morbid obesity (HCC)   . Sleep apnea    Past Surgical History:  Procedure Laterality Date  . ABDOMINAL HYSTERECTOMY    . CESAREAN SECTION    . ELECTROPHYSIOLOGIC STUDY N/A 06/14/2015   Procedure: CARDIOVERSION;  Surgeon: Vesta Mixer, MD;  Location: [facility] ORS;  Service: Cardiovascular;  Laterality: N/A;  . HEEL SPUR EXCISION Left   . knee surgery    . TEE WITHOUT CARDIOVERSION N/A 06/14/2015   Procedure: TRANSESOPHAGEAL  ECHOCARDIOGRAM (TEE);  Surgeon: Vesta Mixer, MD;  Location: [facility] ORS;  Service: Cardiovascular;  Laterality: N/A;   Social History:  reports that she has never smoked. She has never used smokeless tobacco. She reports that she drinks alcohol. She reports that she does not use drugs.  Allergies  Allergen Reactions  . Allegra [Fexofenadine] Other (See Comments)    cough cough  . Diltiazem Hcl Other (See Comments)    Rash, joint pain, itching  . Morphine And Related Nausea Only  . Other Nausea Only  . Claritin [Loratadine] Palpitations    Family History  Problem Relation Age of Onset  . Adopted: Yes  . Hypertension Mother   . Diabetes Mother        possible  . Schizophrenia Mother   . Healthy Son     Prior to Admission medications   Medication Sig Start Date End Date Taking? Authorizing Provider  albuterol (PROVENTIL HFA;VENTOLIN HFA) 108 (90 Base) MCG/ACT inhaler Inhale 2 puffs into the lungs every 6 (six) hours as needed for wheezing or shortness of breath. 10/11/15  Yes Bridget Hartshorn L, PA-C  apixaban (ELIQUIS) 5 MG TABS tablet Take 1 tablet (5 mg total) by mouth 2 (two) times daily. 06/14/15  Yes Katharina Caper, MD  cetirizine (ZYRTEC) 10 MG tablet Take 10 mg by mouth daily. 01/15/17  Yes [provider]  furosemide (LASIX) 40 MG tablet Take 40 mg by mouth 2 (two) times daily.    Yes [provider]  naproxen (NAPROSYN) 500 MG tablet Take 1 tablet (500 mg total)  by mouth 2 (two) times daily with a meal. 01/23/16  Yes Irean Hong, MD  potassium chloride SA (K-DUR,KLOR-CON) 20 MEQ tablet Take 20 mEq by mouth daily.   Yes [provider]  triamterene-hydrochlorothiazide (DYAZIDE) 37.5-25 MG capsule Take 1 capsule by mouth daily. 03/27/17  Yes [provider]  metoprolol (LOPRESSOR) 50 MG tablet Take 1 tablet (50 mg total) by mouth 2 (two) times daily. Patient not taking: Reported on 04/13/2017 06/14/15   Katharina Caper, MD  traMADol (ULTRAM) 50  MG tablet Take 1 tablet (50 mg total) by mouth every 6 (six) hours as needed. Patient not taking: Reported on 04/13/2017 03/26/16   Rebecka Apley, MD   Physical Exam: Vitals:   04/13/17 1200 04/13/17 1230 04/13/17 1300 04/13/17 1430  BP: (!) 137/115 127/84 (!) 141/115 (!) 145/85  Pulse: 81 72 87   Resp: (!) 24  Temp:      TempSrc:      SpO2: 100% 99% 96%   Weight:         General:  No apparent distress, anxious. WDWN, [place]/AT  Eyes: PERRL, EOMI, no scleral icterus, conjunctiva clear  ENT: moist oropharynx without exudate, TM's benign, dentition good  Neck: supple, no lymphadenopathy. No bruits or thyromegaly  Cardiovascular: irregularly irregular without MRG; 2+ peripheral pulses, no JVD, 1+ peripheral edema  Respiratory: CTA biL, good air movement without wheezing, rhonchi or crackled. Respiratory effort normal  Abdomen: soft, non tender to palpation, positive bowel sounds, no guarding, no rebound  Skin: no rashes or lesions  Musculoskeletal: normal bulk and tone, no joint swelling  Psychiatric: normal mood and affect, A&OX3  Neurologic: CN 2-12 grossly intact, Motor strength 5/5 in all 4 groups with symmetric DTR's and non-focal sensory exam  Labs on Admission:  Basic Metabolic Panel:  Recent Labs Lab 04/13/17 0425  NA 138  K 3.2*  CL 101  CO2 25  GLUCOSE 217*  BUN 20  CREATININE 1.02*  CALCIUM 9.0   Liver Function Tests: No results for input(s): AST, ALT, ALKPHOS, BILITOT, PROT, ALBUMIN in the last 168 hours. No results for input(s): LIPASE, AMYLASE in the last 168 hours. No results for input(s): AMMONIA in the last 168 hours. CBC:  Recent Labs Lab 04/13/17 0425  WBC 7.2  HGB 14.8  HCT 44.1  MCV 83.7  PLT 203   Cardiac Enzymes:  Recent Labs Lab 04/13/17 0425 04/13/17 0602  TROPONINI 0.03* 0.03*    BNP (last 3 results) No results for input(s): BNP in the last 8760 hours.  ProBNP (last 3 results) No results for input(s):  PROBNP in the last 8760 hours.  CBG: No results for input(s): GLUCAP in the last 168 hours.  Radiological Exams on Admission: Dg Chest Portable 1 View  Result Date: 04/13/2017 CLINICAL DATA:  Chest pain and shortness of breath EXAM: PORTABLE CHEST 1 VIEW COMPARISON:  Chest radiograph 11/17/2016 FINDINGS: Unchanged cardiomegaly. No focal airspace consolidation or pulmonary edema. No pneumothorax or sizable pleural effusion. IMPRESSION: Cardiomegaly without acute cardiopulmonary disease. Electronically Signed   By: Deatra Robinson M.D.   On: 04/13/2017 05:07    EKG: Independently reviewed.  Assessment/Plan Principal Problem:   Atrial fibrillation with RVR (HCC) Active Problems:   Obesity   Essential hypertension   DOE (dyspnea on exertion)   Will observe on telemetry and follow enzymes. Adjust cardiac meds. Order echo and check TSH. Consult Cardiology. Repeat labs in AM  Diet: low salt Fluids: NS with K+ DVT Prophylaxis:  Lovenox  Code Status: FULL  Family Communication: none  Disposition Plan: home  Time spent: 50 min

## 2017-04-13 NOTE — ED Notes (Signed)
MD notified about pts HR  

## 2017-04-13 NOTE — ED Triage Notes (Signed)
Patient c/o chest tightness, SOB. Patient tachycardiac HR in 200s, afib with RVR per [facility].

## 2017-04-13 NOTE — ED Provider Notes (Signed)
[facility] Emergency Department Provider Note   First MD Initiated Contact with Patient 04/13/17 0345     (approximate)  I have reviewed the triage vital signs and the nursing notes.   HISTORY  Chief Complaint Tachycardia and Chest Pain    HPI Molly Rivera is a 56 y.o. female with below list of chronic medical conditions including atrial fibrillationpresents to the emergency department with acute onset of rapid heartbeat and chest tightness and dyspnea which began at 8:00 PM last night. Patient denies any fever. Patient states current pain score is 5 out of 10. Patient states recent change in management of atrial fibrillation that she was advised to discontinue metoprolol 100 mg twice a day.   Past Medical History:  Diagnosis Date  . A-fib [facility])    a. new onset 05/2015, b. CHADS2VASc => 2 (HTN and sex category); c. echo 05/2015: EF 50-55%, nl wall motion, mild to mod MR, PASP 38 mm Hg  . Allergy   . Arthritis   . Asthma   . COPD (chronic obstructive pulmonary disease) (HCC)   . Diverticulitis   . Hypertension   . Mitral regurgitation    a. echo 05/2015: mild to mod MR  . Morbid obesity (HCC)   . Sleep apnea     Patient Active Problem List   Diagnosis Date Noted  . Obesity 06/14/2015  . Obstructive sleep apnea 06/14/2015  . Asthma 06/14/2015  . Essential hypertension 06/14/2015  . Essential (primary) hypertension 06/14/2015  . Airway hyperreactivity 06/14/2015  . Atrial fibrillation with RVR (HCC) 06/13/2015  . A-fib (HCC) 06/12/2015  . Atrial fibrillation (HCC) 06/12/2015    Past Surgical History:  Procedure Laterality Date  . ABDOMINAL HYSTERECTOMY    . CESAREAN SECTION    . ELECTROPHYSIOLOGIC STUDY N/A 06/14/2015   Procedure: CARDIOVERSION;  Surgeon: Vesta Mixer, MD;  Location: [facility] ORS;  Service: Cardiovascular;  Laterality: N/A;  . HEEL SPUR EXCISION Left   . knee surgery    . TEE WITHOUT CARDIOVERSION N/A 06/14/2015   Procedure: TRANSESOPHAGEAL ECHOCARDIOGRAM (TEE);  Surgeon: Vesta Mixer, MD;  Location: [facility] ORS;  Service: Cardiovascular;  Laterality: N/A;    Prior to Admission medications   Medication Sig Start Date End Date Taking? Authorizing Provider  albuterol (PROVENTIL HFA;VENTOLIN HFA) 108 (90 Base) MCG/ACT inhaler Inhale 2 puffs into the lungs every 6 (six) hours as needed for wheezing or shortness of breath. 10/11/15   Tommi Rumps, PA-C  apixaban (ELIQUIS) 5 MG TABS tablet Take 1 tablet (5 mg total) by mouth 2 (two) times daily. 06/14/15   Katharina Caper, MD  diltiazem (CARDIZEM CD) 120 MG 24 hr capsule Take 1 capsule (120 mg total) by mouth daily. 06/14/15   Katharina Caper, MD  doxycycline (VIBRAMYCIN) 100 MG capsule Take 1 capsule (100 mg) by mouth twice daily for 10 days. 11/18/16   Loleta Rose, MD  Furosemide (LASIX PO) Take by mouth.    [provider]  furosemide (LASIX) 40 MG tablet Take 40 mg by mouth.    [provider]  metoprolol (LOPRESSOR) 50 MG tablet Take 1 tablet (50 mg total) by mouth 2 (two) times daily. 06/14/15   Katharina Caper, MD  naproxen (NAPROSYN) 500 MG tablet Take 1 tablet (500 mg total) by mouth 2 (two) times daily with a meal. 01/23/16   Irean Hong, MD  potassium chloride SA (K-DUR,KLOR-CON) 20 MEQ tablet Take 20 mEq by mouth daily.    [provider]  traMADol (ULTRAM) 50 MG tablet Take 1 tablet (50 mg total) by mouth every 6 (six) hours as needed. 03/26/16   Rebecka Apley, MD    Allergies Allegra [fexofenadine]; Morphine and related; Other; and Claritin [loratadine]  Family History  Problem Relation Age of Onset  . Adopted: Yes  . Hypertension Mother   . Diabetes Mother        possible  . Schizophrenia Mother   . Healthy Son     Social History Social History  Substance Use Topics  . Smoking status: Never Smoker  . Smokeless tobacco: Never Used  . Alcohol use 0.0 oz/week     Comment: occasional beer    Review of  Systems Constitutional: No fever/chills Eyes: No visual changes. ENT: No sore throat. Cardiovascular: positive for chest pain and palpitation Respiratory: positive forshortness of breath. Gastrointestinal: No abdominal pain.  No nausea, no vomiting.  No diarrhea.  No constipation. Genitourinary: Negative for dysuria. Musculoskeletal: Negative for neck pain.  Negative for back pain. Integumentary: Negative for rash. Neurological: Negative for headaches, focal weakness or numbness.   ____________________________________________   PHYSICAL EXAM:  VITAL SIGNS: ED Triage Vitals  Enc Vitals Group     BP 04/13/17 0351 (!) 170/110     Pulse Rate 04/13/17 0357 (!) 30     Resp 04/13/17 0353 16     Temp 04/13/17 0357 97.9 F (36.6 C)     Temp Source 04/13/17 0357 Oral     SpO2 04/13/17 0357 99 %     Weight 04/13/17 0350 (!) 158.8 kg (350 lb)     Height --      Head Circumference --      Peak Flow --      Pain Score 04/13/17 0357 6     Pain Loc --      Pain Edu? --      Excl. in GC? --     Constitutional: Alert and oriented. Well appearing and in no acute distress. Eyes: Conjunctivae are normal.  Head: Atraumatic. Mouth/Throat: Mucous membranes are moist.  Oropharynx non-erythematous. Neck: No stridor.  Cardiovascular: tachycardic, regular rhythm. Good peripheral circulation. Grossly normal heart sounds. Respiratory: Normal respiratory effort.  No retractions. Lungs CTAB. Gastrointestinal: Soft and nontender. No distention.  Musculoskeletal: No lower extremity tenderness nor edema. No gross deformities of extremities. Neurologic:  Normal speech and language. No gross focal neurologic deficits are appreciated.  Skin:  Skin is warm, dry and intact. No rash noted. Psychiatric: Mood and affect are normal. Speech and behavior are normal.  ____________________________________________   LABS (all labs ordered are listed, but only abnormal results are displayed)  Labs Reviewed    BASIC METABOLIC PANEL - Abnormal; Notable for the following:       Result Value   Potassium 3.2 (*)    Glucose, Bld 217 (*)    Creatinine, Ser 1.02 (*)    All other components within normal limits  CBC - Abnormal; Notable for the following:    RBC 5.27 (*)    RDW 16.2 (*)    All other components within normal limits  TROPONIN I - Abnormal; Notable for the following:    Troponin I 0.03 (*)    All other components within normal limits   ____________________________________________  EKG  ED ECG REPORT I, [place] N Regla Fitzgibbon, the attending physician, personally viewed and interpreted this ECG.   Date: 04/13/2017  EKG Time: 3:55 AM  Rate: 144  Rhythm: Atrial fibrillation rapid ventricular response  Axis:normal  Intervals:normal  ST&T Change: none  ____________________________________________  RADIOLOGY I, [place] Tyishia Aune, personally viewed and evaluated these images (plain radiographs) as part of my medical decision making, as well as reviewing the written report by the radiologist.  Dg Chest Portable 1 View  Result Date: 04/13/2017 CLINICAL DATA:  Chest pain and shortness of breath EXAM: PORTABLE CHEST 1 VIEW COMPARISON:  Chest radiograph 11/17/2016 FINDINGS: Unchanged cardiomegaly. No focal airspace consolidation or pulmonary edema. No pneumothorax or sizable pleural effusion. IMPRESSION: Cardiomegaly without acute cardiopulmonary disease. Electronically Signed   By: Deatra Robinson M.D.   On: 04/13/2017 05:07      Procedures  CRITICAL CARE Performed by: Darci Current   Total critical care time: 40 minutes  Critical care time was exclusive of separately billable procedures and treating other patients.  Critical care was necessary to treat or prevent imminent or life-threatening deterioration.  Critical care was time spent personally by me on the following activities: development of treatment plan with patient and/or surrogate as well as nursing, discussions with  consultants, evaluation of patient's response to treatment, examination of patient, obtaining history from patient or surrogate, ordering and performing treatments and interventions, ordering and review of laboratory studies, ordering and review of radiographic studies, pulse oximetry and re-evaluation of patient's condition.  ____________________________________________   INITIAL IMPRESSION / ASSESSMENT AND PLAN / ED COURSE  Pertinent labs & imaging results that were available during my care of the patient were reviewed by me and considered in my medical decision making (see chart for details).  56 year old female presenting above stated history of physical exam consistent with atrial fibrillation rapid ventricular response. Patient given diltiazem 20 mg IV with rate control obtained. Patient's current heart rate less than 100. However for approximately 2 hours patient's heart rate increased again to the 140s and variable and a such an additional 20 mg of IV diltiazem was given with rate control achieved again. Patient was also given by mouth Coreg at that time. Patient discussed with Dr. Shaune Pollack who will assume care of the patient to observe the patient here in the emergency department and if rate control not maintain to admit the patient.     ____________________________________________  FINAL CLINICAL IMPRESSION(S) / ED DIAGNOSES  Final diagnoses:  Atrial fibrillation with rapid ventricular response (HCC)     MEDICATIONS GIVEN DURING THIS VISIT:  Medications  diltiazem (CARDIZEM) injection 20 mg (20 mg Intravenous Given 04/13/17 0403)     NEW OUTPATIENT MEDICATIONS STARTED DURING THIS VISIT:  New Prescriptions   No medications on file    Modified Medications   No medications on file    Discontinued Medications   No medications on file     Note:  This document was prepared using Dragon voice recognition software and may include unintentional dictation errors.    Darci Current, MD 04/14/17 [phone]

## 2017-04-14 ENCOUNTER — Observation Stay
Admit: 2017-04-14 | Discharge: 2017-04-14 | Disposition: A | Discharge status: Home / Self Care | Payer: Self-pay | Attending: Internal Medicine | Admitting: Internal Medicine

## 2017-04-14 LAB — COMPREHENSIVE METABOLIC PANEL
ALBUMIN: 3.4 g/dL — AB (ref 3.5–5.0)
ALK PHOS: 68 U/L (ref 38–126)
ALT: 19 U/L (ref 14–54)
AST: 24 U/L (ref 15–41)
Anion gap: 9 (ref 5–15)
BILIRUBIN TOTAL: 1.1 mg/dL (ref 0.3–1.2)
BUN: 19 mg/dL (ref 6–20)
CALCIUM: 8.9 mg/dL (ref 8.9–10.3)
CO2: 25 mmol/L (ref 22–32)
CREATININE: 1.16 mg/dL — AB (ref 0.44–1.00)
Chloride: 105 mmol/L (ref 101–111)
GFR calc Af Amer: >60 mL/min (ref >60)
GFR, EST NON AFRICAN AMERICAN: 53 mL/min — AB (ref >60)
GLUCOSE: 147 mg/dL — AB (ref 65–99)
POTASSIUM: 4 mmol/L (ref 3.5–5.1)
Sodium: 139 mmol/L (ref 135–145)
TOTAL PROTEIN: 7 g/dL (ref 6.5–8.1)

## 2017-04-14 LAB — CBC
HEMATOCRIT: 42.5 % (ref 35.0–47.0)
Hemoglobin: 14.3 g/dL (ref 12.0–16.0)
MCH: 28.4 pg (ref 26.0–34.0)
MCHC: 33.6 g/dL (ref 32.0–36.0)
MCV: 84.3 fL (ref 80.0–100.0)
PLATELETS: 203 10*3/uL (ref 150–440)
RBC: 5.05 MIL/uL (ref 3.80–5.20)
RDW: 15.9 % — AB (ref 11.5–14.5)
WBC: 7.6 10*3/uL (ref 3.6–11.0)

## 2017-04-14 LAB — GLUCOSE, CAPILLARY
GLUCOSE-CAPILLARY: 124 mg/dL — AB (ref 65–99)
Glucose-Capillary: 166 mg/dL — ABNORMAL HIGH (ref 65–99)
Glucose-Capillary: 124 mg/dL — ABNORMAL HIGH (ref 65–99)
Glucose-Capillary: 120 mg/dL — ABNORMAL HIGH (ref 65–99)
Glucose-Capillary: 187 mg/dL — ABNORMAL HIGH (ref 65–99)

## 2017-04-14 LAB — TROPONIN I: Troponin I: <0.03 ng/mL (ref <0.03)

## 2017-04-14 MED ORDER — METOPROLOL TARTRATE 50 MG PO TABS
150.0000 mg | ORAL_TABLET | Freq: Two times a day (BID) | ORAL | Status: DC
  Administered 2017-04-14 – 2017-04-15 (×2): 150 mg via ORAL
  Filled 2017-04-14 (×2): qty 3

## 2017-04-14 MED ORDER — METOPROLOL TARTRATE 100 MG PO TABS: 100.0000 mg | ORAL_TABLET | Freq: Two times a day (BID) | ORAL | 1 refills | Status: DC

## 2017-04-14 MED ORDER — METOPROLOL TARTRATE 75 MG PO TABS: 150.0000 mg | ORAL_TABLET | Freq: Two times a day (BID) | ORAL | 0 refills | Status: DC

## 2017-04-14 MED ORDER — METOPROLOL TARTRATE 50 MG PO TABS: 100.0000 mg | ORAL_TABLET | Freq: Two times a day (BID) | ORAL | Status: DC

## 2017-04-14 MED ORDER — METOPROLOL TARTRATE 50 MG PO TABS
100.0000 mg | ORAL_TABLET | Freq: Two times a day (BID) | ORAL | Status: DC
  Administered 2017-04-14: 100 mg via ORAL
  Filled 2017-04-14: qty 2

## 2017-04-14 MED ORDER — OXYCODONE-ACETAMINOPHEN 5-325 MG PO TABS
1.0000 | ORAL_TABLET | Freq: Four times a day (QID) | ORAL | Status: DC | PRN
  Administered 2017-04-14: 1 via ORAL
  Filled 2017-04-14: qty 1

## 2017-04-14 MED ORDER — IPRATROPIUM-ALBUTEROL 0.5-2.5 (3) MG/3ML IN SOLN
3.0000 mL | Freq: Two times a day (BID) | RESPIRATORY_TRACT | Status: DC
  Filled 2017-04-14 (×2): qty 3

## 2017-04-14 MED ORDER — METOPROLOL TARTRATE 50 MG PO TABS
50.0000 mg | ORAL_TABLET | Freq: Once | ORAL | Status: AC
  Administered 2017-04-14: 50 mg via ORAL
  Filled 2017-04-14: qty 1

## 2017-04-14 NOTE — Progress Notes (Signed)
Pt placed on CPAP machine at 2210.

## 2017-04-14 NOTE — Plan of Care (Signed)
Problem: Activity: Goal: Ability to tolerate increased activity will improve Outcome: Not Progressing Ambulated patient around nurses station, HR elevated with exertion into 130's. Patient stated she felt tired and was having some back pain. Patient taken back to room and assisted into chair. After sitting patients HR decreased to 80's currently in atrial fibrillation.

## 2017-04-14 NOTE — Progress Notes (Signed)
*  PRELIMINARY RESULTS* Echocardiogram 2D Echocardiogram has been performed.  Cristela Blue 04/14/2017, 9:28 AM

## 2017-04-14 NOTE — Progress Notes (Signed)
[employer] - [place] [facility]   PATIENT NAME: Molly Rivera    MR#:  [id number]  DATE OF BIRTH:  10-26-1960  SUBJECTIVE:  CHIEF COMPLAINT:   Chief Complaint  Patient presents with  . Tachycardia  . Chest Pain   palpitation and generalized weakness. Afib RVR at 130-150s on exertion. REVIEW OF SYSTEMS:  Review of Systems  Constitutional: Positive for malaise/fatigue. Negative for chills and fever.  HENT: Negative for sore throat.   Eyes: Negative for blurred vision and double vision.  Respiratory: Negative for cough, hemoptysis, shortness of breath, wheezing and stridor.   Cardiovascular: Positive for palpitations. Negative for chest pain, orthopnea and leg swelling.  Gastrointestinal: Negative for abdominal pain, blood in stool, diarrhea, melena, nausea and vomiting.  Genitourinary: Negative for dysuria, flank pain and hematuria.  Musculoskeletal: Negative for back pain and joint pain.  Neurological: Negative for dizziness, sensory change, focal weakness, seizures, loss of consciousness, weakness and headaches.  Endo/Heme/Allergies: Negative for polydipsia.  Psychiatric/Behavioral: Negative for depression. The patient is not nervous/anxious.     DRUG ALLERGIES:   Allergies  Allergen Reactions  . Allegra [Fexofenadine] Other (See Comments)    cough cough  . Diltiazem Hcl Other (See Comments)    Rash, joint pain, itching  . Morphine And Related Nausea Only  . Other Nausea Only  . Claritin [Loratadine] Palpitations   VITALS:  Blood pressure (!) 149/95, pulse (!) 103, temperature (!) 97.5 F (36.4 C), temperature source Oral, resp. rate 18, weight (!) 351 lb 11.2 oz (159.5 kg), SpO2 98 %. PHYSICAL EXAMINATION:  Physical Exam  Constitutional: She is oriented to person, place, and time and well-developed, well-nourished, and in no distress.  HENT:  Head: Normocephalic.  Mouth/Throat: Oropharynx is clear and moist.  Eyes: Pupils are equal,  round, and reactive to light. Conjunctivae and EOM are normal. No scleral icterus.  Neck: Normal range of motion. Neck supple. No JVD present. No tracheal deviation present.  Cardiovascular: Normal rate and normal heart sounds.  Exam reveals no gallop.   No murmur heard. Irregular heart rate and rhythm.  Pulmonary/Chest: Effort normal and breath sounds normal. No respiratory distress. She has no wheezes. She has no rales.  Abdominal: Soft. Bowel sounds are normal. She exhibits no distension. There is no tenderness. There is no rebound.  Musculoskeletal: Normal range of motion. She exhibits no edema or tenderness.  Neurological: She is alert and oriented to person, place, and time. No cranial nerve deficit.  Skin: No rash noted. No erythema.  Psychiatric: Affect normal.   LABORATORY PANEL:  Female CBC  Recent Labs Lab 04/14/17 0450  WBC 7.6  HGB 14.3  HCT 42.5  PLT 203   ------------------------------------------------------------------------------------------------------------------ Chemistries   Recent Labs Lab 04/14/17 0450  NA 139  K 4.0  CL 105  CO2 25  GLUCOSE 147*  BUN 19  CREATININE 1.16*  CALCIUM 8.9  AST 24  ALT 19  ALKPHOS 68  BILITOT 1.1   RADIOLOGY:  No results found. ASSESSMENT AND PLAN:   Chronic persistent Atrial fibrillation with RVR Still tachycardia on exertion with heart rates from 30-150. Increase Lopressor to 150 twice a day for Dr. Gwen Pounds. Continue Eliquis. Per Dr. Gwen Pounds, the patient may be discharged to home today. But the patient still has A. Fib with RVR on exertion.  Essential hypertension. Continue current hypertension medication. Morbid obesity. She needs outpatient sleep study to rule out OSA. COPD. Stable NEB when necessary. I discussed with Dr. Gwen Pounds All  the records are reviewed and case discussed with Care Management/Social Worker. Management plans discussed with the patient, her mother and they are in agreement.  CODE  STATUS: Full Code  TOTAL TIME TAKING CARE OF THIS PATIENT: 38 minutes.   More than 50% of the time was spent in counseling/coordination of care: YES  POSSIBLE D/C IN 1 DAYS, DEPENDING ON CLINICAL CONDITION.   Shaune Pollack M.D on 04/14/2017 at 4:47 PM  Between 7am to 6pm - Pager - [phone]  After 6pm go to [email or web address] - Therapist, nutritional Hospitalists

## 2017-04-14 NOTE — Progress Notes (Signed)
[facility] Encounter Note  Patient: Molly Rivera / Admit Date: 04/13/2017 / Date of Encounter: 04/14/2017, 1:20 PM   Subjective: Patient is still short of breath with physical activity and has a rapid ventricular rate with physical activity with metoprolol. The patient has had good heart rate control at rest. There is been no evidence of myocardial infarction or congestive heart failure at this time.  Review of Systems: Positive for: Shortness of breath Negative for: Vision change, hearing change, syncope, dizziness, nausea, vomiting,diarrhea, bloody stool, stomach pain, cough, congestion, diaphoresis, urinary frequency, urinary pain,skin lesions, skin rashes Others previously listed  Objective: Telemetry: Atrial fibrillation with controlled ventricular rate Physical Exam: Blood pressure (!) 149/95, pulse (!) 103, temperature (!) 97.5 F (36.4 C), temperature source Oral, resp. rate 18, weight (!) 159.5 kg (351 lb 11.2 oz), SpO2 98 %. Body mass index is 60.37 kg/m. General: Well developed, well nourished, in no acute distress. Head: Normocephalic, atraumatic, sclera non-icteric, no xanthomas, nares are without discharge. Neck: No apparent masses Lungs: Normal respirations with no wheezes, no rhonchi, no rales , no crackles   Heart: Irregular rate and rhythm, normal S1 S2, no murmur, no rub, no gallop, PMI is normal size and placement, carotid upstroke normal without bruit, jugular venous pressure normal Abdomen: Soft, non-tender, non-distended with normoactive bowel sounds. No hepatosplenomegaly. Abdominal aorta is normal size without bruit Extremities: Trace edema, no clubbing, no cyanosis, no ulcers,  Peripheral: 2+ radial, 2+ femoral, 2+ dorsal pedal pulses Neuro: Alert and oriented. Moves all extremities spontaneously. Psych:  Responds to questions appropriately with a normal affect.   Intake/Output Summary (Last 24 hours) at 04/14/17 1320 Last data  filed at 04/14/17 1000  Gross per 24 hour  Intake          1343.75 ml  Output             1651 ml  Net          -307.25 ml    Inpatient Medications:  . apixaban  5 mg Oral BID  . docusate sodium  100 mg Oral BID  . furosemide  40 mg Oral BID  . Influenza vac split quadrivalent PF  0.5 mL Intramuscular Tomorrow-1000  . insulin aspart  0-9 Units Subcutaneous TID WC  . ipratropium-albuterol  3 mL Nebulization BID  . metoprolol tartrate  150 mg Oral Q12H  . naproxen  500 mg Oral BID WC  . potassium chloride  40 mEq Oral BID  . triamterene-hydrochlorothiazide  1 tablet Oral Daily   Infusions:   Labs:  Recent Labs  04/13/17 0425 04/14/17 0450  NA 138 139  K 3.2* 4.0  CL 101 105  CO2 25 25  GLUCOSE 217* 147*  BUN 20 19  CREATININE 1.02* 1.16*  CALCIUM 9.0 8.9    Recent Labs  04/14/17 0450  AST 24  ALT 19  ALKPHOS 68  BILITOT 1.1  PROT 7.0  ALBUMIN 3.4*    Recent Labs  04/13/17 0425 04/14/17 0450  WBC 7.2 7.6  HGB 14.8 14.3  HCT 44.1 42.5  MCV 83.7 84.3  PLT 203 203    Recent Labs  04/13/17 0602 04/13/17 1658 04/13/17 2221 04/14/17 0447  TROPONINI 0.03* 0.03* <0.03 <0.03   Invalid input(s): POCBNP No results for input(s): HGBA1C in the last 72 hours.   Weights: Filed Weights   04/13/17 0350 04/14/17 0346  Weight: (!) 158.8 kg (350 lb) (!) 159.5 kg (351 lb 11.2 oz)     Radiology/Studies:  Dg Chest Portable 1 View  Result Date: 04/13/2017 CLINICAL DATA:  Chest pain and shortness of breath EXAM: PORTABLE CHEST 1 VIEW COMPARISON:  Chest radiograph 11/17/2016 FINDINGS: Unchanged cardiomegaly. No focal airspace consolidation or pulmonary edema. No pneumothorax or sizable pleural effusion. IMPRESSION: Cardiomegaly without acute cardiopulmonary disease. Electronically Signed   By: Deatra Robinson M.D.   On: 04/13/2017 05:07     Assessment and Recommendation  56 y.o. female with essential hypertension mixed hyperlipidemia with paroxysmal nonvalvular  atrial fibrillation with rapid ventricular rate likely secondary to the patient's stopping metoprolol over the last several days in hopes of changing to carvedilol at 25 mg twice per day for which she has not received in the mail yet. The patient therefore would likely benefit from a trial of carvedilol at high dose for heart rate control and also rhythm medication management as necessary thereafter. The patient will likely have continued shortness of breath with either medication management 1. Beta blocker for heart rate control of atrial fibrillation and patient may transition to carvedilol 25 mg twice per day and give a trial following for improvements 2. Continue anti-coagulation for further risk reduction in stroke with atrial fibrillation without change 3. Okay for discharge home with follow-up thereafter for adjustments of medications as necessary  Signed, Arnoldo Hooker M.D. FACC

## 2017-04-14 NOTE — Progress Notes (Addendum)
Pt ambulated around nurses station and HR remained in 140-160's. Dr. Imogene Burn and Dr. Gwen Pounds notified. Per Dr. Imogene Burn will keep pt one more night to monitor HR.

## 2017-04-15 LAB — ECHOCARDIOGRAM COMPLETE: WEIGHTICAEL: 5627.2 [oz_av]

## 2017-04-15 LAB — HIV ANTIBODY (ROUTINE TESTING W REFLEX): HIV Screen 4th Generation wRfx: NONREACTIVE

## 2017-04-15 LAB — GLUCOSE, CAPILLARY: Glucose-Capillary: 136 mg/dL — ABNORMAL HIGH (ref 65–99)

## 2017-04-15 NOTE — Discharge Summary (Signed)
[facility] [place] [facility]   PATIENT NAME: Molly Rivera    MR#:  [id number]  DATE OF BIRTH:  September 02, 1960  DATE OF ADMISSION:  04/13/2017   ADMITTING PHYSICIAN: Marguarite Arbour, MD  DATE OF DISCHARGE: 04/15/2017 PRIMARY CARE PHYSICIAN: Sandrea Hughs, NP   ADMISSION DIAGNOSIS:  Atrial fibrillation with rapid ventricular response (HCC) [I48.91] DISCHARGE DIAGNOSIS:  Principal Problem:   Atrial fibrillation with RVR (HCC) Active Problems:   Obesity   Essential hypertension   DOE (dyspnea on exertion)  SECONDARY DIAGNOSIS:   Past Medical History:  Diagnosis Date  . A-fib [facility])    a. new onset 05/2015, b. CHADS2VASc => 2 (HTN and sex category); c. echo 05/2015: EF 50-55%, nl wall motion, mild to mod MR, PASP 38 mm Hg  . Allergy   . Arthritis   . Asthma   . COPD (chronic obstructive pulmonary disease) (HCC)   . Diverticulitis   . Hypertension   . Mitral regurgitation    a. echo 05/2015: mild to mod MR  . Morbid obesity (HCC)   . Sleep apnea    HOSPITAL COURSE:  Chronic persistent Atrial fibrillation with RVR HR is controlled on rest but achycardia on exertion with heart rates was up to147. Increased Lopressor to 150 twice a day per Dr. Gwen Pounds. Continue Eliquis. Per Dr. Gwen Pounds, the patient may be discharged to home today and follow-up him as outpatient.  But the patient complains of nausea, generalized weakness, shortness of breath and headache.she doesn't want to be discharged.  Essential hypertension. Continue current hypertension medication. Morbid obesity and OSA. CPAP at night. COPD. Stable NEB when necessary. I discussed with Dr. Gwen Pounds  DISCHARGE CONDITIONS:  Stable, discharge to home today. CONSULTS OBTAINED:   DRUG ALLERGIES:   Allergies  Allergen Reactions  . Allegra [Fexofenadine] Other (See Comments)    cough cough  . Diltiazem Hcl Other (See Comments)    Rash, joint pain, itching  . Morphine And Related  Nausea Only  . Other Nausea Only  . Claritin [Loratadine] Palpitations   DISCHARGE MEDICATIONS:   Allergies as of 04/15/2017      Reactions   Allegra [fexofenadine] Other (See Comments)   cough cough   Diltiazem Hcl Other (See Comments)   Rash, joint pain, itching   Morphine And Related Nausea Only   Other Nausea Only   Claritin [loratadine] Palpitations      Medication List    STOP taking these medications   naproxen 500 MG tablet Commonly known as:  NAPROSYN     TAKE these medications   albuterol 108 (90 Base) MCG/ACT inhaler Commonly known as:  PROVENTIL HFA;VENTOLIN HFA Inhale 2 puffs into the lungs every 6 (six) hours as needed for wheezing or shortness of breath.   apixaban 5 MG Tabs tablet Commonly known as:  ELIQUIS Take 1 tablet (5 mg total) by mouth 2 (two) times daily.   cetirizine 10 MG tablet Commonly known as:  ZYRTEC Take 10 mg by mouth daily.   furosemide 40 MG tablet Commonly known as:  LASIX Take 40 mg by mouth 2 (two) times daily.   Metoprolol Tartrate 75 MG Tabs Take 150 mg by mouth every 12 (twelve) hours. What changed:  medication strength  how much to take  when to take this   potassium chloride SA 20 MEQ tablet Commonly known as:  K-DUR,KLOR-CON Take 20 mEq by mouth daily.   traMADol 50 MG tablet Commonly known as:  ULTRAM Take 1  tablet (50 mg total) by mouth every 6 (six) hours as needed.   triamterene-hydrochlorothiazide 37.5-25 MG capsule Commonly known as:  DYAZIDE Take 1 capsule by mouth daily.            Discharge Care Instructions        Start     Ordered   04/15/17 0000  Increase activity slowly     04/15/17 1046   04/15/17 0000  Diet - low sodium heart healthy     04/15/17 1046   04/14/17 0000  metoprolol tartrate 75 MG TABS  Every 12 hours     04/14/17 1137       DISCHARGE INSTRUCTIONS:  See AVS.  If you experience worsening of your admission symptoms, develop shortness of breath, life threatening  emergency, suicidal or homicidal thoughts you must seek medical attention immediately by calling 911 or calling your MD immediately  if symptoms less severe.  You Must read complete instructions/literature along with all the possible adverse reactions/side effects for all the Medicines you take and that have been prescribed to you. Take any new Medicines after you have completely understood and accpet all the possible adverse reactions/side effects.   Please note  You were cared for by a hospitalist during your hospital stay. If you have any questions about your discharge medications or the care you received while you were in the hospital after you are discharged, you can call the unit and asked to speak with the hospitalist on call if the hospitalist that took care of you is not available. Once you are discharged, your primary care physician will handle any further medical issues. Please note that NO REFILLS for any discharge medications will be authorized once you are discharged, as it is imperative that you return to your primary care physician (or establish a relationship with a primary care physician if you do not have one) for your aftercare needs so that they can reassess your need for medications and monitor your lab values.    On the day of Discharge:  VITAL SIGNS:  Blood pressure (!) 159/104, pulse (!) 56, temperature (!) 97.5 F (36.4 C), temperature source Oral, resp. rate 17, weight (!) 349 lb 11.2 oz (158.6 kg), SpO2 100 %. PHYSICAL EXAMINATION:  GENERAL:  56 y.o.-year-old patient lying in the bed with no acute distress. Morbid obesity. EYES: Pupils equal, round, reactive to light and accommodation. No scleral icterus. Extraocular muscles intact.  HEENT: Head atraumatic, normocephalic. Oropharynx and nasopharynx clear.  NECK:  Supple, no jugular venous distention. No thyroid enlargement, no tenderness.  LUNGS: Normal breath sounds bilaterally, no wheezing, rales,rhonchi or  crepitation. No use of accessory muscles of respiration.  CARDIOVASCULAR: S1, S2 normal. No murmurs, rubs, or gallops.  ABDOMEN: Soft, non-tender, non-distended. Bowel sounds present. No organomegaly or mass.  EXTREMITIES: No pedal edema, cyanosis, or clubbing.  NEUROLOGIC: Cranial nerves II through XII are intact. Muscle strength 5/5 in all extremities. Sensation intact. Gait not checked.  PSYCHIATRIC: The patient is alert and oriented x 3.  SKIN: No obvious rash, lesion, or ulcer.  DATA REVIEW:   CBC  Recent Labs Lab 04/14/17 0450  WBC 7.6  HGB 14.3  HCT 42.5  PLT 203    Chemistries   Recent Labs Lab 04/14/17 0450  NA 139  K 4.0  CL 105  CO2 25  GLUCOSE 147*  BUN 19  CREATININE 1.16*  CALCIUM 8.9  AST 24  ALT 19  ALKPHOS 68  BILITOT 1.1  Microbiology Results  No results found for this or any previous visit.  RADIOLOGY:  No results found.   Management plans discussed with the patient, family and they are in agreement.  CODE STATUS: Full Code   TOTAL TIME TAKING CARE OF THIS PATIENT: 37 minutes.    Shaune Pollack M.D on 04/15/2017 at 1:46 PM  Between 7am to 6pm - Pager - [phone]  After 6pm go to [email or web address] - Social research officer, government  [facility] [place] [facility]  Office  [phone]  CC: Primary care physician; Sandrea Hughs, NP   Note: This dictation was prepared with Dragon dictation along with smaller phrase technology. Any transcriptional errors that result from this process are unintentional.

## 2017-04-15 NOTE — Discharge Instructions (Signed)
Heart healthy diet

## 2017-04-15 NOTE — Care Management (Signed)
CM screen due to lack of payor.  Patient is followed by [facility] and receives all of her mediations  free through a mail order program.  She is in the process of applying for disability.  Does not require home 02.  Has chronic home cpap. No discharge needs identified by members of the care team

## 2017-04-22 ENCOUNTER — Ambulatory Visit
Admission: RE | Admit: 2017-04-22 | Discharge: 2017-04-22 | Disposition: A | Discharge status: Home / Self Care | Payer: Disability Insurance | Source: Ambulatory Visit | Attending: Pediatrics | Admitting: Pediatrics

## 2017-04-22 ENCOUNTER — Other Ambulatory Visit: Payer: Self-pay | Admitting: Pediatrics

## 2017-04-22 ENCOUNTER — Ambulatory Visit (HOSPITAL_COMMUNITY): Payer: Disability Insurance

## 2017-04-22 DIAGNOSIS — J45909 Unspecified asthma, uncomplicated: Secondary | ICD-10-CM | POA: Diagnosis not present

## 2017-04-22 DIAGNOSIS — J449 Chronic obstructive pulmonary disease, unspecified: Secondary | ICD-10-CM

## 2017-04-22 DIAGNOSIS — M25562 Pain in left knee: Secondary | ICD-10-CM | POA: Diagnosis present

## 2017-04-22 DIAGNOSIS — M25561 Pain in right knee: Secondary | ICD-10-CM

## 2017-04-22 DIAGNOSIS — R030 Elevated blood-pressure reading, without diagnosis of hypertension: Secondary | ICD-10-CM | POA: Insufficient documentation

## 2017-04-22 DIAGNOSIS — G4730 Sleep apnea, unspecified: Secondary | ICD-10-CM | POA: Insufficient documentation

## 2017-04-22 DIAGNOSIS — M170 Bilateral primary osteoarthritis of knee: Secondary | ICD-10-CM | POA: Diagnosis not present

## 2017-04-22 DIAGNOSIS — I158 Other secondary hypertension: Secondary | ICD-10-CM

## 2017-04-22 MED ORDER — ALBUTEROL SULFATE (2.5 MG/3ML) 0.083% IN NEBU
2.5000 mg | INHALATION_SOLUTION | Freq: Once | RESPIRATORY_TRACT | Status: AC
  Administered 2017-04-22: 2.5 mg via RESPIRATORY_TRACT
  Filled 2017-04-22: qty 3

## 2018-04-02 ENCOUNTER — Other Ambulatory Visit: Payer: Self-pay

## 2018-04-02 ENCOUNTER — Emergency Department: Payer: Disability Insurance

## 2018-04-02 ENCOUNTER — Encounter: Payer: Self-pay | Admitting: Emergency Medicine

## 2018-04-02 DIAGNOSIS — J449 Chronic obstructive pulmonary disease, unspecified: Secondary | ICD-10-CM | POA: Insufficient documentation

## 2018-04-02 DIAGNOSIS — Z79899 Other long term (current) drug therapy: Secondary | ICD-10-CM | POA: Insufficient documentation

## 2018-04-02 DIAGNOSIS — M722 Plantar fascial fibromatosis: Secondary | ICD-10-CM | POA: Insufficient documentation

## 2018-04-02 DIAGNOSIS — I10 Essential (primary) hypertension: Secondary | ICD-10-CM | POA: Insufficient documentation

## 2018-04-02 DIAGNOSIS — M79672 Pain in left foot: Secondary | ICD-10-CM | POA: Insufficient documentation

## 2018-04-02 DIAGNOSIS — Z7901 Long term (current) use of anticoagulants: Secondary | ICD-10-CM | POA: Insufficient documentation

## 2018-04-02 NOTE — ED Triage Notes (Signed)
Pt to triage via w/c with no distress noted; pt reports having left foot pain x 2 days with no known injury; denies swelling

## 2018-04-03 ENCOUNTER — Emergency Department
Admission: EM | Admit: 2018-04-03 | Discharge: 2018-04-03 | Disposition: A | Discharge status: Home / Self Care | Payer: Disability Insurance | Attending: Emergency Medicine | Admitting: Emergency Medicine

## 2018-04-03 DIAGNOSIS — M79672 Pain in left foot: Secondary | ICD-10-CM

## 2018-04-03 DIAGNOSIS — M722 Plantar fascial fibromatosis: Secondary | ICD-10-CM

## 2018-04-03 MED ORDER — OXYCODONE-ACETAMINOPHEN 5-325 MG PO TABS
1.0000 | ORAL_TABLET | Freq: Once | ORAL | Status: AC
  Administered 2018-04-03: 1 via ORAL
  Filled 2018-04-03: qty 1

## 2018-04-03 MED ORDER — OXYCODONE-ACETAMINOPHEN 5-325 MG PO TABS: 1.0000 | ORAL_TABLET | ORAL | 0 refills | Status: DC | PRN

## 2018-04-03 NOTE — ED Notes (Signed)
Podiatric shoe and crutches provided to pt.

## 2018-04-03 NOTE — Discharge Instructions (Signed)
1.  You may take Percocet as needed for pain. 2.  Wear podiatric shoe and use crutches to walk as needed. 3.  Return to the ER for worsening symptoms, persistent vomiting, difficulty breathing or other concerns.

## 2018-04-03 NOTE — ED Provider Notes (Signed)
[facility] Emergency Department Provider Note   ____________________________________________   First MD Initiated Contact with Patient 04/03/18 0018     (approximate)  I have reviewed the triage vital signs and the nursing notes.   HISTORY  Chief Complaint Foot Pain    HPI Molly Rivera is a 57 y.o. female who presents to the ED from home with a chief complaint of nontraumatic left foot pain.  Patient reports a 2-day history of left foot pain.  States she had increased activity with walking in and out of her house which requires stepping on a sloped metal piece.  Denies fall or injury.  Complains of pain to the bottom and top of her left foot.  Has been using a cane to ambulate.  Denies extremity weakness, numbness or tingling.  Denies recent fever, chills, chest pain, shortness of breath, abdominal pain, nausea or vomiting.   Past Medical History:  Diagnosis Date  . A-fib [facility])    a. new onset 05/2015, b. CHADS2VASc => 2 (HTN and sex category); c. echo 05/2015: EF 50-55%, nl wall motion, mild to mod MR, PASP 38 mm Hg  . Allergy   . Arthritis   . Asthma   . COPD (chronic obstructive pulmonary disease) (HCC)   . Diverticulitis   . Hypertension   . Mitral regurgitation    a. echo 05/2015: mild to mod MR  . Morbid obesity (HCC)   . Sleep apnea     Patient Active Problem List   Diagnosis Date Noted  . DOE (dyspnea on exertion) 04/13/2017  . Obesity 06/14/2015  . Obstructive sleep apnea 06/14/2015  . Asthma 06/14/2015  . Essential hypertension 06/14/2015  . Essential (primary) hypertension 06/14/2015  . Airway hyperreactivity 06/14/2015  . Atrial fibrillation with RVR (HCC) 06/13/2015  . A-fib (HCC) 06/12/2015  . Atrial fibrillation (HCC) 06/12/2015    Past Surgical History:  Procedure Laterality Date  . ABDOMINAL HYSTERECTOMY    . CESAREAN SECTION    . ELECTROPHYSIOLOGIC STUDY N/A 06/14/2015   Procedure: CARDIOVERSION;  Surgeon:  Vesta Mixer, MD;  Location: [facility] ORS;  Service: Cardiovascular;  Laterality: N/A;  . HEEL SPUR EXCISION Left   . knee surgery    . TEE WITHOUT CARDIOVERSION N/A 06/14/2015   Procedure: TRANSESOPHAGEAL ECHOCARDIOGRAM (TEE);  Surgeon: Vesta Mixer, MD;  Location: [facility] ORS;  Service: Cardiovascular;  Laterality: N/A;    Prior to Admission medications   Medication Sig Start Date End Date Taking? Authorizing Provider  albuterol (PROVENTIL HFA;VENTOLIN HFA) 108 (90 Base) MCG/ACT inhaler Inhale 2 puffs into the lungs every 6 (six) hours as needed for wheezing or shortness of breath. 10/11/15   Tommi Rumps, PA-C  apixaban (ELIQUIS) 5 MG TABS tablet Take 1 tablet (5 mg total) by mouth 2 (two) times daily. 06/14/15   Katharina Caper, MD  cetirizine (ZYRTEC) 10 MG tablet Take 10 mg by mouth daily. 01/15/17   [provider]  furosemide (LASIX) 40 MG tablet Take 40 mg by mouth 2 (two) times daily.     [provider]  metoprolol tartrate 75 MG TABS Take 150 mg by mouth every 12 (twelve) hours. 04/14/17   Shaune Pollack, MD  potassium chloride SA (K-DUR,KLOR-CON) 20 MEQ tablet Take 20 mEq by mouth daily.    [provider]  traMADol (ULTRAM) 50 MG tablet Take 1 tablet (50 mg total) by mouth every 6 (six) hours as needed. Patient not taking: Reported on 04/13/2017 03/26/16   Lucrezia Europe  P, MD  triamterene-hydrochlorothiazide (DYAZIDE) 37.5-25 MG capsule Take 1 capsule by mouth daily. 03/27/17   [provider]    Allergies Allegra [fexofenadine]; Diltiazem hcl; Morphine and related; Other; and Claritin [loratadine]  Family History  Adopted: Yes  Problem Relation Age of Onset  . Hypertension Mother   . Diabetes Mother        possible  . Schizophrenia Mother   . Healthy Son     Social History Social History   Tobacco Use  . Smoking status: Never Smoker  . Smokeless tobacco: Never Used  Substance Use Topics  . Alcohol use: Yes    Alcohol/week: 0.0  standard drinks    Comment: occasional beer  . Drug use: No    Review of Systems  Constitutional: No fever/chills Eyes: No visual changes. ENT: No sore throat. Cardiovascular: Denies chest pain. Respiratory: Denies shortness of breath. Gastrointestinal: No abdominal pain.  No nausea, no vomiting.  No diarrhea.  No constipation. Genitourinary: Negative for dysuria. Musculoskeletal: Positive for left foot pain.  Negative for back pain. Skin: Negative for rash. Neurological: Negative for headaches, focal weakness or numbness.   ____________________________________________   PHYSICAL EXAM:  VITAL SIGNS: ED Triage Vitals  Enc Vitals Group     BP 04/02/18 2320 (!) 159/90     Pulse Rate 04/02/18 2320 79     Resp 04/02/18 2320 18     Temp 04/02/18 2320 97.9 F (36.6 C)     Temp Source 04/02/18 2320 Oral     SpO2 04/02/18 2320 99 %     Weight 04/02/18 2319 (!) 340 lb (154.2 kg)     Height 04/02/18 2319 5\' 3"  (1.6 m)     Head Circumference --      Peak Flow --      Pain Score 04/02/18 2318 7     Pain Loc --      Pain Edu? --      Excl. in GC? --     Constitutional: Alert and oriented. Well appearing and in no acute distress. Eyes: Conjunctivae are normal. PERRL. EOMI. Head: Atraumatic. Nose: No congestion/rhinnorhea. Mouth/Throat: Mucous membranes are moist.  Oropharynx non-erythematous. Neck: No stridor.  No cervical spine tenderness to palpation. Cardiovascular: Normal rate, regular rhythm. Grossly normal heart sounds.  Good peripheral circulation. Respiratory: Normal respiratory effort.  No retractions. Lungs CTAB. Gastrointestinal: Soft and nontender. No distention. No abdominal bruits. No CVA tenderness. Musculoskeletal: Left foot tender to palpation sole and dorsal aspect.  Full range of motion with some pain.  No pedal edema.  No joint effusions. Neurologic:  Normal speech and language. No gross focal neurologic deficits are appreciated.  Skin:  Skin is warm, dry  and intact. No rash noted. Psychiatric: Mood and affect are normal. Speech and behavior are normal.  ____________________________________________   LABS (all labs ordered are listed, but only abnormal results are displayed)  Labs Reviewed - No data to display ____________________________________________  EKG  None ____________________________________________  RADIOLOGY  ED MD interpretation: Degenerative changes, probable plantar fasciitis  Official radiology report(s): Dg Foot Complete Left  Result Date: 04/03/2018 CLINICAL DATA:  LEFT foot pain EXAM: LEFT FOOT - COMPLETE 3+ VIEW COMPARISON:  11/22/2013 FINDINGS: Diffuse osseous demineralization. Mild scattered narrowing of IP joints. No acute fracture, dislocation, or bone destruction. Mild intertarsal degenerative changes with spurring at naviculocuneiform joint. Soft tissue calcifications at plantar fascia. IMPRESSION: Degenerative changes. No acute osseous abnormalities. Soft tissue calcifications at the plantar fascia, may reflect plantar fasciitis or prior  injury. Electronically Signed   By: Ulyses SouthwardMark  Boles M.D.   On: 04/03/2018 00:14    ____________________________________________   PROCEDURES  Procedure(s) performed: None  Procedures  Critical Care performed: No  ____________________________________________   INITIAL IMPRESSION / ASSESSMENT AND PLAN / ED COURSE  As part of my medical decision making, I reviewed the following data within the electronic MEDICAL RECORD NUMBER Nursing notes reviewed and incorporated and Notes from prior ED visits   57 year old female who presents with nontraumatic left foot pain.  She has had prior surgery on that foot due to plantar fasciitis.  Patient is on Eliquis.  For that reason, will hold NSAIDs.  Will treat with Percocet for pain, place and podiatric shoe, crutches for ambulation and patient will follow-up with podiatry.  Strict return precautions given.  Patient verbalizes  understanding and agrees with plan of care.      ____________________________________________   FINAL CLINICAL IMPRESSION(S) / ED DIAGNOSES  Final diagnoses:  Foot pain, left  Plantar fasciitis of left foot     ED Discharge Orders    None       Note:  This document was prepared using Dragon voice recognition software and may include unintentional dictation errors.    Irean HongSung, Jade J, MD 04/03/18 [phone]

## 2018-04-04 ENCOUNTER — Emergency Department
Admission: EM | Admit: 2018-04-04 | Discharge: 2018-04-04 | Disposition: A | Discharge status: Home / Self Care | Payer: Self-pay | Attending: Emergency Medicine | Admitting: Emergency Medicine

## 2018-04-04 ENCOUNTER — Encounter: Payer: Self-pay | Admitting: Emergency Medicine

## 2018-04-04 ENCOUNTER — Other Ambulatory Visit: Payer: Self-pay

## 2018-04-04 ENCOUNTER — Emergency Department: Payer: Self-pay

## 2018-04-04 DIAGNOSIS — I10 Essential (primary) hypertension: Secondary | ICD-10-CM | POA: Insufficient documentation

## 2018-04-04 DIAGNOSIS — I482 Chronic atrial fibrillation, unspecified: Secondary | ICD-10-CM

## 2018-04-04 DIAGNOSIS — R0602 Shortness of breath: Secondary | ICD-10-CM | POA: Insufficient documentation

## 2018-04-04 DIAGNOSIS — Z7901 Long term (current) use of anticoagulants: Secondary | ICD-10-CM | POA: Insufficient documentation

## 2018-04-04 DIAGNOSIS — J449 Chronic obstructive pulmonary disease, unspecified: Secondary | ICD-10-CM | POA: Insufficient documentation

## 2018-04-04 DIAGNOSIS — Z79899 Other long term (current) drug therapy: Secondary | ICD-10-CM | POA: Insufficient documentation

## 2018-04-04 LAB — CBC
HCT: 39.2 % (ref 35.0–47.0)
Hemoglobin: 13.1 g/dL (ref 12.0–16.0)
MCH: 28.8 pg (ref 26.0–34.0)
MCHC: 33.4 g/dL (ref 32.0–36.0)
MCV: 86.4 fL (ref 80.0–100.0)
PLATELETS: 230 10*3/uL (ref 150–440)
RBC: 4.54 MIL/uL (ref 3.80–5.20)
RDW: 14.7 % — ABNORMAL HIGH (ref 11.5–14.5)
WBC: 7.2 10*3/uL (ref 3.6–11.0)

## 2018-04-04 LAB — BASIC METABOLIC PANEL
Anion gap: 8 (ref 5–15)
BUN: 24 mg/dL — ABNORMAL HIGH (ref 6–20)
CALCIUM: 9.2 mg/dL (ref 8.9–10.3)
CO2: 35 mmol/L — ABNORMAL HIGH (ref 22–32)
CREATININE: 1.38 mg/dL — AB (ref 0.44–1.00)
Chloride: 98 mmol/L (ref 98–111)
GFR, EST AFRICAN AMERICAN: 49 mL/min — AB (ref >60)
GFR, EST NON AFRICAN AMERICAN: 42 mL/min — AB (ref >60)
Glucose, Bld: 165 mg/dL — ABNORMAL HIGH (ref 70–99)
Potassium: 2.9 mmol/L — ABNORMAL LOW (ref 3.5–5.1)
SODIUM: 141 mmol/L (ref 135–145)

## 2018-04-04 LAB — TROPONIN I

## 2018-04-04 NOTE — ED Triage Notes (Signed)
Pt has been feeling SHOB.  Reports she feels like when in a fib. Has had some discomfort in chest. In fib but rate is controlled at this time.  Alert and oriented.  Feels worse when standing.

## 2018-04-04 NOTE — ED Provider Notes (Signed)
[facility] Emergency Department Provider Note  Time seen: 9:38 AM  I have reviewed the triage vital signs and the nursing notes.   HISTORY  Chief Complaint Shortness of Breath    HPI Molly Blalockatricia Rivera is a 57 y.o. female with a past medical history of atrial fibrillation on apixaban, COPD, hypertension, obesity, presents to the emergency department for shortness of breath.  According to the patient she was seen in the emergency department very early yesterday morning for left foot pain.  Had negative x-rays.  States several days ago she was walking up a steep hill in her yard when she began to lose her balance she stepped back with her left foot and since that time is been experiencing pain.  Patient was given Percocet.  She states yesterday she was very sleepy throughout the day after taking the Percocet.  She did notice that she was nauseated at times especially after getting up from her bed.  Did not vomit.  Last night she began feeling somewhat short of breath with a vague discomfort across her chest which she describes as a tightness.  States it feels like it does when she is in atrial fibrillation.  No pleuritic pain.  No cough.   Past Medical History:  Diagnosis Date  . A-fib [facility](HCC)    a. new onset 05/2015, b. CHADS2VASc => 2 (HTN and sex category); c. echo 05/2015: EF 50-55%, nl wall motion, mild to mod MR, PASP 38 mm Hg  . Allergy   . Arthritis   . Asthma   . COPD (chronic obstructive pulmonary disease) (HCC)   . Diverticulitis   . Hypertension   . Mitral regurgitation    a. echo 05/2015: mild to mod MR  . Morbid obesity (HCC)   . Sleep apnea     Patient Active Problem List   Diagnosis Date Noted  . DOE (dyspnea on exertion) 04/13/2017  . Obesity 06/14/2015  . Obstructive sleep apnea 06/14/2015  . Asthma 06/14/2015  . Essential hypertension 06/14/2015  . Essential (primary) hypertension 06/14/2015  . Airway hyperreactivity 06/14/2015  . Atrial  fibrillation with RVR (HCC) 06/13/2015  . A-fib (HCC) 06/12/2015  . Atrial fibrillation (HCC) 06/12/2015    Past Surgical History:  Procedure Laterality Date  . ABDOMINAL HYSTERECTOMY    . CESAREAN SECTION    . ELECTROPHYSIOLOGIC STUDY N/A 06/14/2015   Procedure: CARDIOVERSION;  Surgeon: Vesta MixerPhilip J Nahser, MD;  Location: [facility] ORS;  Service: Cardiovascular;  Laterality: N/A;  . HEEL SPUR EXCISION Left   . knee surgery    . TEE WITHOUT CARDIOVERSION N/A 06/14/2015   Procedure: TRANSESOPHAGEAL ECHOCARDIOGRAM (TEE);  Surgeon: Vesta MixerPhilip J Nahser, MD;  Location: [facility] ORS;  Service: Cardiovascular;  Laterality: N/A;    Prior to Admission medications   Medication Sig Start Date End Date Taking? Authorizing Provider  albuterol (PROVENTIL HFA;VENTOLIN HFA) 108 (90 Base) MCG/ACT inhaler Inhale 2 puffs into the lungs every 6 (six) hours as needed for wheezing or shortness of breath. 10/11/15   Tommi RumpsSummers, Rhonda L, PA-C  apixaban (ELIQUIS) 5 MG TABS tablet Take 1 tablet (5 mg total) by mouth 2 (two) times daily. 06/14/15   Katharina CaperVaickute, Rima, MD  cetirizine (ZYRTEC) 10 MG tablet Take 10 mg by mouth daily. 01/15/17   [provider]  furosemide (LASIX) 40 MG tablet Take 40 mg by mouth 2 (two) times daily.     [provider]  metoprolol tartrate 75 MG TABS Take 150 mg by mouth every 12 (twelve)  hours. 04/14/17   Shaune Pollack, MD  oxyCODONE-acetaminophen (PERCOCET/ROXICET) 5-325 MG tablet Take 1 tablet by mouth every 4 (four) hours as needed for severe pain. 04/03/18   Irean Hong, MD  potassium chloride SA (K-DUR,KLOR-CON) 20 MEQ tablet Take 20 mEq by mouth daily.    [provider]  traMADol (ULTRAM) 50 MG tablet Take 1 tablet (50 mg total) by mouth every 6 (six) hours as needed. Patient not taking: Reported on 04/13/2017 03/26/16   Rebecka Apley, MD  triamterene-hydrochlorothiazide (DYAZIDE) 37.5-25 MG capsule Take 1 capsule by mouth daily. 03/27/17   [provider]     Allergies  Allergen Reactions  . Allegra [Fexofenadine] Other (See Comments)    cough cough  . Diltiazem Hcl Other (See Comments)    Rash, joint pain, itching  . Morphine And Related Nausea Only  . Other Nausea Only  . Claritin [Loratadine] Palpitations    Family History  Adopted: Yes  Problem Relation Age of Onset  . Hypertension Mother   . Diabetes Mother        possible  . Schizophrenia Mother   . Healthy Son     Social History Social History   Tobacco Use  . Smoking status: Never Smoker  . Smokeless tobacco: Never Used  Substance Use Topics  . Alcohol use: Yes    Alcohol/week: 0.0 standard drinks    Comment: occasional beer  . Drug use: No    Review of Systems Constitutional: Negative for fever. ENT: Negative for recent illness/congestion Cardiovascular: Mild chest tightness Respiratory: Mild shortness of breath Gastrointestinal: Negative for abdominal pain, vomiting and diarrhea. Genitourinary: Negative for urinary compaints Musculoskeletal: Left foot pain x3 days. Skin: Negative for skin complaints  Neurological: Negative for headache All other ROS negative  ____________________________________________   PHYSICAL EXAM:  VITAL SIGNS: ED Triage Vitals  Enc Vitals Group     BP 04/04/18 0827 (!) 140/51     Pulse Rate 04/04/18 0827 89     Resp 04/04/18 0827 (!) 24     Temp 04/04/18 0827 (!) 97.5 F (36.4 C)     Temp Source 04/04/18 0827 Oral     SpO2 04/04/18 0827 97 %     Weight 04/04/18 0825 (!) 340 lb (154.2 kg)     Height 04/04/18 0825 5\' 3"  (1.6 m)     Head Circumference --      Peak Flow --      Pain Score 04/04/18 0825 0     Pain Loc --      Pain Edu? --      Excl. in GC? --    Constitutional: Alert and oriented. Well appearing and in no distress. Eyes: Normal exam ENT   Head: Normocephalic and atraumatic.   Mouth/Throat: Mucous membranes are moist. Cardiovascular: Irregular rhythm rate around 100 bpm.  No  murmur. Respiratory: Normal respiratory effort without tachypnea nor retractions. Breath sounds are clear Gastrointestinal: Soft and nontender. No distention.  Musculoskeletal: Nontender with normal range of motion in all extremities. Neurologic:  Normal speech and language. No gross focal neurologic deficits  Skin:  Skin is warm, dry and intact.  Psychiatric: Mood and affect are normal.   ____________________________________________    EKG  EKG reviewed and interpreted by myself shows atrial fibrillation at 102 bpm with a narrow QRS, normal axis, normal intervals, nonspecific ST changes.  ____________________________________________    RADIOLOGY  Chest x-ray shows cardiomegaly, no acute findings.  ____________________________________________   INITIAL IMPRESSION / ASSESSMENT AND  PLAN / ED COURSE  Pertinent labs & imaging results that were available during my care of the patient were reviewed by me and considered in my medical decision making (see chart for details).  Patient presents to the emergency department for shortness of breath with mild chest tightness.  Differential would include ACS, pneumonia, pneumothorax, URI, A. fib.  Patient is EKG does show atrial fibrillation however in reviewing the patient's prior EKGs it appears that she is in chronic A. fib she takes apixaban for anticoagulation.  She is rate controlled around 80 to 100 bpm.  But this could explain her shortness of breath.  Patient's chest x-ray is clear, labs are largely at baseline slight renal insufficiency compared to baseline.  Mild hypokalemia patient takes daily potassium supplements.  Patient's troponin is negative.  As the patient appears well with otherwise normal work-up I believe the patient is safe for discharge home with cardiology follow-up Monday.  Patient agreeable to plan of care.  I discussed return precautions for any worsening trouble breathing or development of chest pain or pleuritic pain.   Patient agreeable to plan.  ____________________________________________   FINAL CLINICAL IMPRESSION(S) / ED DIAGNOSES  Dyspnea Atrial fibrillation    Minna Antis, MD 04/04/18 [phone]

## 2018-04-13 ENCOUNTER — Encounter: Payer: Self-pay | Admitting: Emergency Medicine

## 2018-04-13 ENCOUNTER — Emergency Department: Payer: Self-pay

## 2018-04-13 ENCOUNTER — Other Ambulatory Visit: Payer: Self-pay

## 2018-04-13 ENCOUNTER — Emergency Department
Admission: EM | Admit: 2018-04-13 | Discharge: 2018-04-13 | Disposition: A | Discharge status: Home / Self Care | Payer: Self-pay | Attending: Emergency Medicine | Admitting: Emergency Medicine

## 2018-04-13 DIAGNOSIS — M7731 Calcaneal spur, right foot: Secondary | ICD-10-CM | POA: Insufficient documentation

## 2018-04-13 DIAGNOSIS — Z79899 Other long term (current) drug therapy: Secondary | ICD-10-CM | POA: Insufficient documentation

## 2018-04-13 DIAGNOSIS — J45909 Unspecified asthma, uncomplicated: Secondary | ICD-10-CM | POA: Insufficient documentation

## 2018-04-13 DIAGNOSIS — E790 Hyperuricemia without signs of inflammatory arthritis and tophaceous disease: Secondary | ICD-10-CM | POA: Insufficient documentation

## 2018-04-13 DIAGNOSIS — I10 Essential (primary) hypertension: Secondary | ICD-10-CM | POA: Insufficient documentation

## 2018-04-13 DIAGNOSIS — M79671 Pain in right foot: Secondary | ICD-10-CM

## 2018-04-13 DIAGNOSIS — Z7901 Long term (current) use of anticoagulants: Secondary | ICD-10-CM | POA: Insufficient documentation

## 2018-04-13 LAB — CBC WITH DIFFERENTIAL/PLATELET
BASOS ABS: 0.1 10*3/uL (ref 0–0.1)
BASOS PCT: 1 %
Eosinophils Absolute: 0.1 10*3/uL (ref 0–0.7)
Eosinophils Relative: 2 %
HCT: 36.4 % (ref 35.0–47.0)
HEMOGLOBIN: 12.4 g/dL (ref 12.0–16.0)
Lymphocytes Relative: 17 %
Lymphs Abs: 1.5 10*3/uL (ref 1.0–3.6)
MCH: 29.8 pg (ref 26.0–34.0)
MCHC: 34.1 g/dL (ref 32.0–36.0)
MCV: 87.3 fL (ref 80.0–100.0)
Monocytes Absolute: 0.6 10*3/uL (ref 0.2–0.9)
Monocytes Relative: 7 %
NEUTROS PCT: 73 %
Neutro Abs: 6.6 10*3/uL — ABNORMAL HIGH (ref 1.4–6.5)
Platelets: 198 10*3/uL (ref 150–440)
RBC: 4.17 MIL/uL (ref 3.80–5.20)
RDW: 15.1 % — ABNORMAL HIGH (ref 11.5–14.5)
WBC: 9 10*3/uL (ref 3.6–11.0)

## 2018-04-13 LAB — URIC ACID: Uric Acid, Serum: 11.5 mg/dL — ABNORMAL HIGH (ref 2.5–7.1)

## 2018-04-13 MED ORDER — OXYCODONE-ACETAMINOPHEN 7.5-325 MG PO TABS
1.0000 | ORAL_TABLET | Freq: Once | ORAL | Status: AC
  Administered 2018-04-13: 1 via ORAL
  Filled 2018-04-13: qty 1

## 2018-04-13 MED ORDER — ONDANSETRON 4 MG PO TBDP
4.0000 mg | ORAL_TABLET | Freq: Once | ORAL | Status: AC
  Administered 2018-04-13: 4 mg via ORAL
  Filled 2018-04-13: qty 1

## 2018-04-13 MED ORDER — DEXAMETHASONE SODIUM PHOSPHATE 10 MG/ML IJ SOLN
10.0000 mg | Freq: Once | INTRAMUSCULAR | Status: AC
  Administered 2018-04-13: 10 mg via INTRAMUSCULAR
  Filled 2018-04-13: qty 1

## 2018-04-13 NOTE — ED Provider Notes (Signed)
[place] Emergency Department Provider Note   ____________________________________________   First MD Initiated Contact with Patient 04/13/18 1211     (approximate)  I have reviewed the triage vital signs and the nursing notes.   HISTORY  Chief Complaint Foot Pain   HPI Molly Rivera is a 57 y.o. female presents to the ED via EMS with complaint of her right foot hurting.  Patient denies any recent injury.  She states that she has been told in the past that she has a spur on her heel.  She was seen in the ED on 6/13 for the same problem.  She states she has an appointment with her PCP in 2 days.  Patient states she is unable to bear weight on her foot and that crutches are not helping.  Patient did take oxycodone at 2 AM and states that this is not helped.  She rates her pain currently as 10/10.   Past Medical History:  Diagnosis Date  . A-fib [facility])    a. new onset 05/2015, b. CHADS2VASc => 2 (HTN and sex category); c. echo 05/2015: EF 50-55%, nl wall motion, mild to mod MR, PASP 38 mm Hg  . Allergy   . Arthritis   . Asthma   . COPD (chronic obstructive pulmonary disease) (HCC)   . Diverticulitis   . Hypertension   . Mitral regurgitation    a. echo 05/2015: mild to mod MR  . Morbid obesity (HCC)   . Sleep apnea     Patient Active Problem List   Diagnosis Date Noted  . DOE (dyspnea on exertion) 04/13/2017  . Obesity 06/14/2015  . Obstructive sleep apnea 06/14/2015  . Asthma 06/14/2015  . Essential hypertension 06/14/2015  . Essential (primary) hypertension 06/14/2015  . Airway hyperreactivity 06/14/2015  . Atrial fibrillation with RVR (HCC) 06/13/2015  . A-fib (HCC) 06/12/2015  . Atrial fibrillation (HCC) 06/12/2015    Past Surgical History:  Procedure Laterality Date  . ABDOMINAL HYSTERECTOMY    . CESAREAN SECTION    . ELECTROPHYSIOLOGIC STUDY N/A 06/14/2015   Procedure: CARDIOVERSION;  Surgeon: Vesta Mixer, MD;  Location:  [facility] ORS;  Service: Cardiovascular;  Laterality: N/A;  . HEEL SPUR EXCISION Left   . knee surgery    . TEE WITHOUT CARDIOVERSION N/A 06/14/2015   Procedure: TRANSESOPHAGEAL ECHOCARDIOGRAM (TEE);  Surgeon: Vesta Mixer, MD;  Location: [facility] ORS;  Service: Cardiovascular;  Laterality: N/A;    Prior to Admission medications   Medication Sig Start Date End Date Taking? Authorizing Provider  albuterol (PROVENTIL HFA;VENTOLIN HFA) 108 (90 Base) MCG/ACT inhaler Inhale 2 puffs into the lungs every 6 (six) hours as needed for wheezing or shortness of breath. 10/11/15   Tommi Rumps, PA-C  apixaban (ELIQUIS) 5 MG TABS tablet Take 1 tablet (5 mg total) by mouth 2 (two) times daily. 06/14/15   Katharina Caper, MD  cetirizine (ZYRTEC) 10 MG tablet Take 10 mg by mouth daily. 01/15/17   [provider]  furosemide (LASIX) 40 MG tablet Take 40 mg by mouth 2 (two) times daily.     [provider]  metoprolol tartrate 75 MG TABS Take 150 mg by mouth every 12 (twelve) hours. 04/14/17   Shaune Pollack, MD  oxyCODONE-acetaminophen (PERCOCET/ROXICET) 5-325 MG tablet Take 1 tablet by mouth every 4 (four) hours as needed for severe pain. 04/03/18   Irean Hong, MD  potassium chloride SA (K-DUR,KLOR-CON) 20 MEQ tablet Take 20 mEq by mouth daily.  [provider]  triamterene-hydrochlorothiazide (DYAZIDE) 37.5-25 MG capsule Take 1 capsule by mouth daily. 03/27/17   [provider]    Allergies Allegra [fexofenadine]; Diltiazem hcl; Morphine and related; Other; and Claritin [loratadine]  Family History  Adopted: Yes  Problem Relation Age of Onset  . Hypertension Mother   . Diabetes Mother        possible  . Schizophrenia Mother   . Healthy Son     Social History Social History   Tobacco Use  . Smoking status: Never Smoker  . Smokeless tobacco: Never Used  Substance Use Topics  . Alcohol use: Yes    Alcohol/week: 0.0 standard drinks    Comment: occasional beer  . Drug  use: No    Review of Systems Constitutional: No fever/chills Cardiovascular: Denies chest pain. Respiratory: Denies shortness of breath. Musculoskeletal: Positive right foot pain. Skin: Negative for rash. Neurological: Negative for headaches, focal weakness or numbness. ___________________________________________   PHYSICAL EXAM:  VITAL SIGNS: ED Triage Vitals  Enc Vitals Group     BP 04/13/18 1200 (!) 156/97     Pulse Rate 04/13/18 1200 83     Resp 04/13/18 1200 18     Temp 04/13/18 1200 97.7 F (36.5 C)     Temp Source 04/13/18 1200 Oral     SpO2 04/13/18 1200 99 %     Weight 04/13/18 1157 (!) 339 lb 15.2 oz (154.2 kg)     Height 04/13/18 1157 5\' 3"  (1.6 m)     Head Circumference --      Peak Flow --      Pain Score --      Pain Loc --      Pain Edu? --      Excl. in GC? --    Constitutional: Alert and oriented. Well appearing and in no acute distress. Eyes: Conjunctivae are normal.  Head: Atraumatic. Neck: No stridor.   Cardiovascular: Normal rate, regular rhythm. Grossly normal heart sounds.  Good peripheral circulation. Respiratory: Normal respiratory effort.  No retractions. Lungs CTAB. Musculoskeletal: Examination of the right foot there is no gross deformity noted.  There is no erythema or warmth to suggest an acute gout episode.  Patient is markedly tender to any palpation.  Range of motion is restricted secondary to pain.  Skin is intact.  Capillary refills less than 3 seconds.  Patient is able to flex and extend her foot without any difficulties. Neurologic:  Normal speech and language. No gross focal neurologic deficits are appreciated.  Skin:  Skin is warm, dry and intact.  Psychiatric: Mood and affect are normal. Speech and behavior are normal.  ____________________________________________   LABS (all labs ordered are listed, but only abnormal results are displayed)  Labs Reviewed  URIC ACID - Abnormal; Notable for the following components:       Result Value   Uric Acid, Serum 11.5 (*)    All other components within normal limits  CBC WITH DIFFERENTIAL/PLATELET - Abnormal; Notable for the following components:   RDW 15.1 (*)    Neutro Abs 6.6 (*)    All other components within normal limits   RADIOLOGY  ED MD interpretation:   Right foot x-ray positive for heel spurs and calcaneal spurs.    Official radiology report(s): Dg Foot Complete Right  Result Date: 04/13/2018 CLINICAL DATA:  Pain for 2 days. EXAM: RIGHT FOOT COMPLETE - 3+ VIEW COMPARISON:  None. FINDINGS: Osseous alignment is normal. Bone mineralization is normal. No fracture line or  displaced fracture fragment seen. No acute or suspicious osseous lesion. Mild degenerative spurring within the midfoot. No large osteophytes or other signs of advanced degenerative joint disease. Additional chronic spurring noted along the plantar and dorsal margins of the posterior calcaneus. IMPRESSION: 1. No acute findings. 2. Mild degenerative spurring within the midfoot and posterior calcaneus. Electronically Signed   By: Bary RichardStan  Maynard M.D.   On: 04/13/2018 13:05   ____________________________________________   PROCEDURES  Procedure(s) performed: None  Procedures  Critical Care performed: No  ____________________________________________   INITIAL IMPRESSION / ASSESSMENT AND PLAN / ED COURSE  As part of my medical decision making, I reviewed the following data within the electronic MEDICAL RECORD NUMBER Notes from prior ED visits and [place] Controlled Substance Database  Patient presents to the ED via EMS with complaint of right foot pain.  This is not the first episode for her foot pain.  There is been no injury since her last visit to the ED.  Patient states that this morning she was unable to put any weight on it.  She took oxycodone at 2 AM without any relief.  X-ray shows calcaneal spurs.  Patient is markedly tender.  Uric acid was elevated.  Patient states that she has oxycodone at  home.  She was given an injection of Decadron 10 mg IM.  She was encouraged to use crutches to assist her in walking.  She also is encouraged to keep her appointment with her PCP this week. ____________________________________________   FINAL CLINICAL IMPRESSION(S) / ED DIAGNOSES  Final diagnoses:  Foot pain, right  Hyperuricemia  Heel spur, right     ED Discharge Orders    None       Note:  This document was prepared using Dragon voice recognition software and may include unintentional dictation errors.    Tommi RumpsSummers, Rhonda L, PA-C 04/13/18 1647    Jene EveryKinner, Robert, MD 04/14/18 [phone]

## 2018-04-13 NOTE — ED Triage Notes (Signed)
First Nurse Note:  Arrives EMS for c/o heel pain. Has history of heel spurs.  Seen through ED on 6/13 for same.

## 2018-04-13 NOTE — ED Notes (Signed)
Pt verbalizes understanding of d/c instructions, medications and follow up 

## 2018-04-13 NOTE — ED Triage Notes (Signed)
Presents with pain to right foot  States pain is mainly to top of foot  Was seen last week for pain to left foot    States pain to left foot has eased off now having increased pain to right foot

## 2018-04-13 NOTE — Discharge Instructions (Signed)
Keep your appointment with your primary care provider this week.  Elevate foot as needed for swelling or pain.  Continue taking your pain medication at home.  Talk to your provider about your elevated uric acid level.  Also read the information about a low purine diet which will also help decrease the uric acid in your blood.

## 2018-07-12 IMAGING — CR DG KNEE COMPLETE 4+V*L*
4 series · 4 of 4 positions shown · non-contrast
Comparison: None.

CLINICAL DATA: Chronic bilateral knee pain, history of fall 1 year
ago, initial encounter

EXAM:
LEFT KNEE - COMPLETE 4+ VIEW

[knee ap]
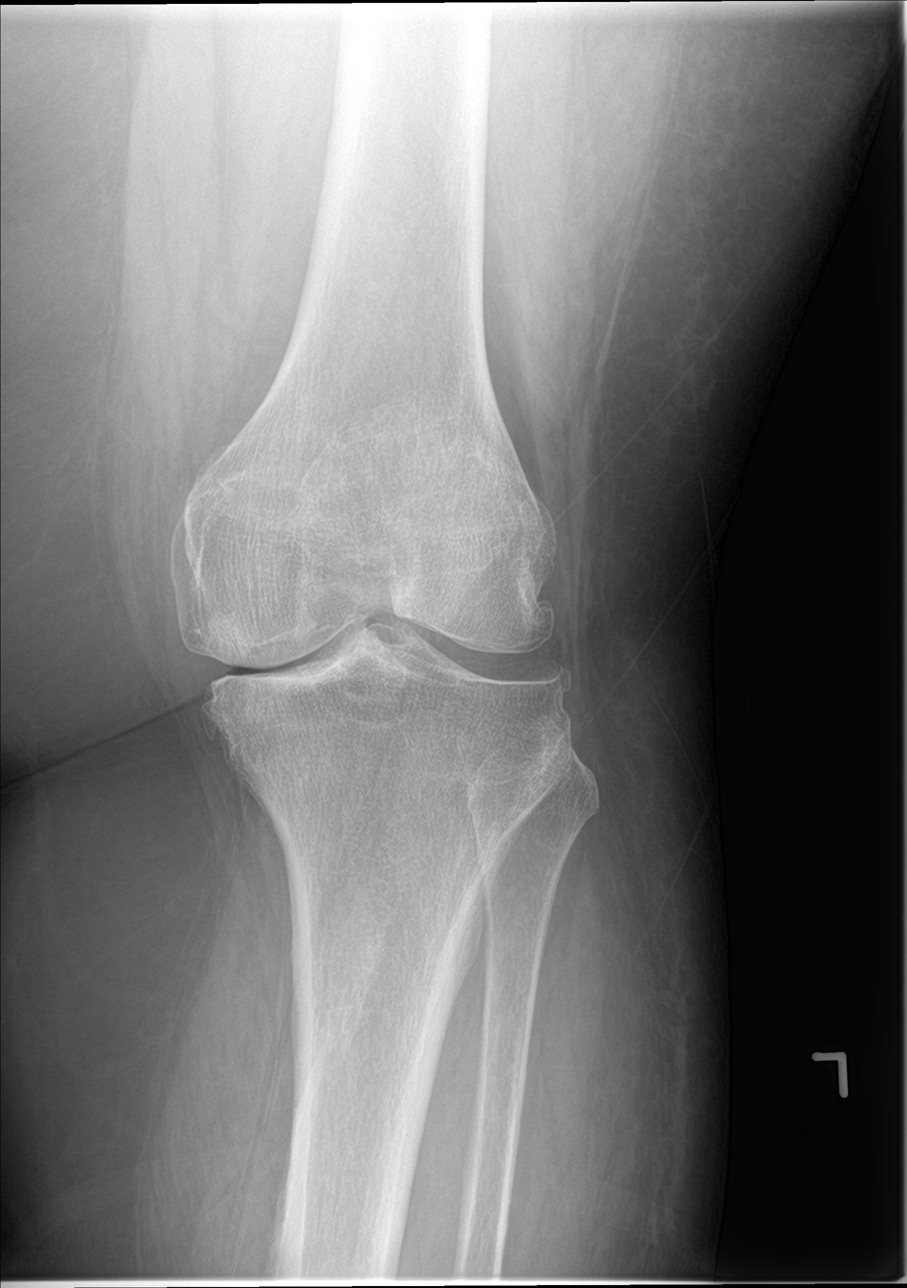

[knee obl (1 of 2)]
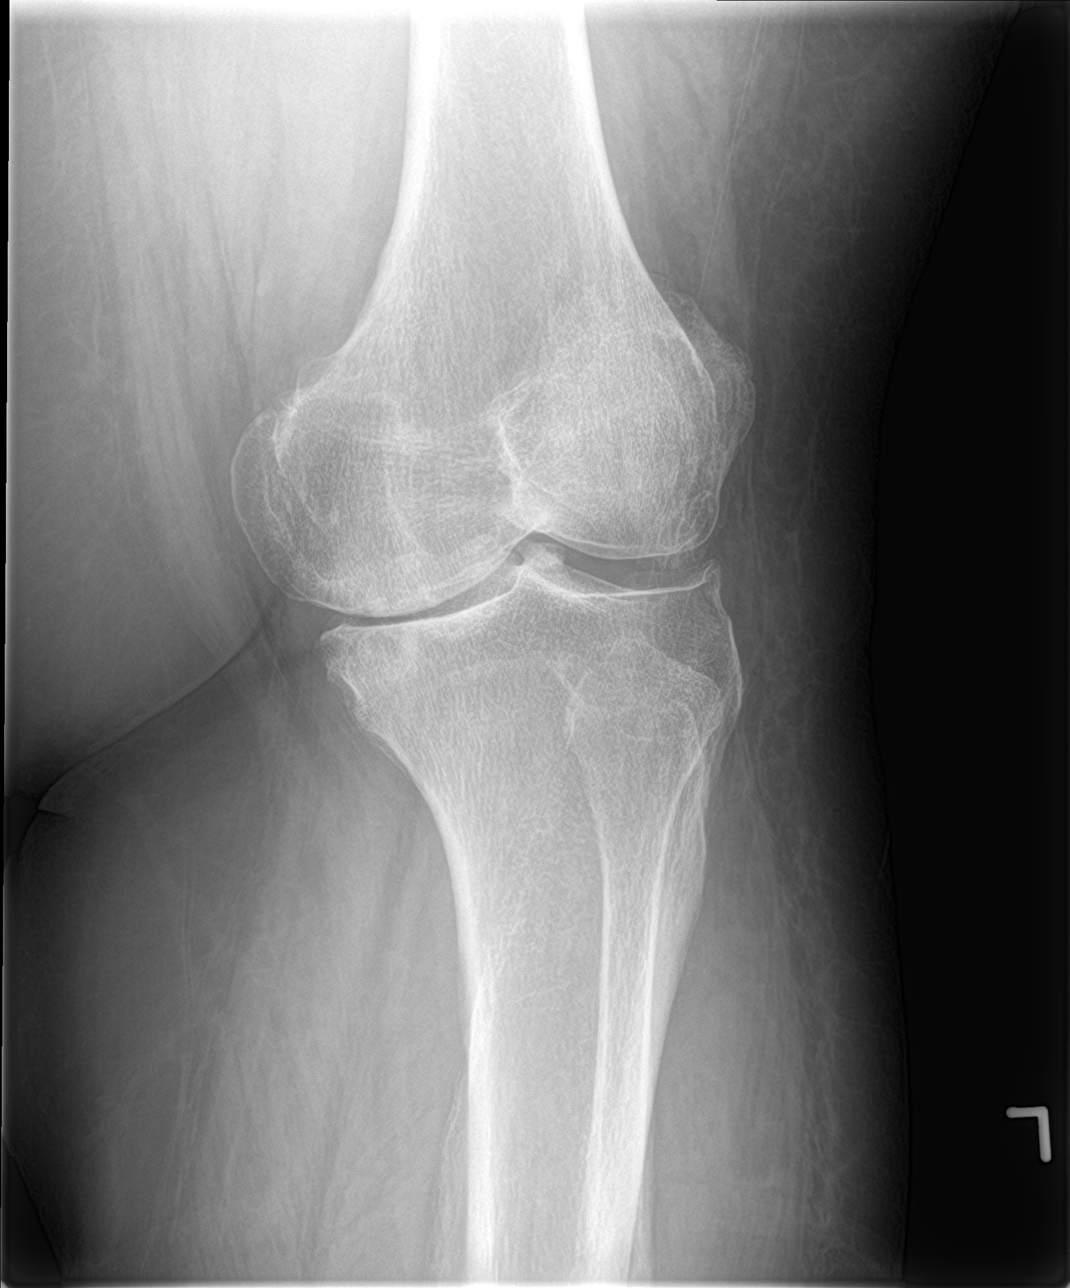

[knee obl (2 of 2)]
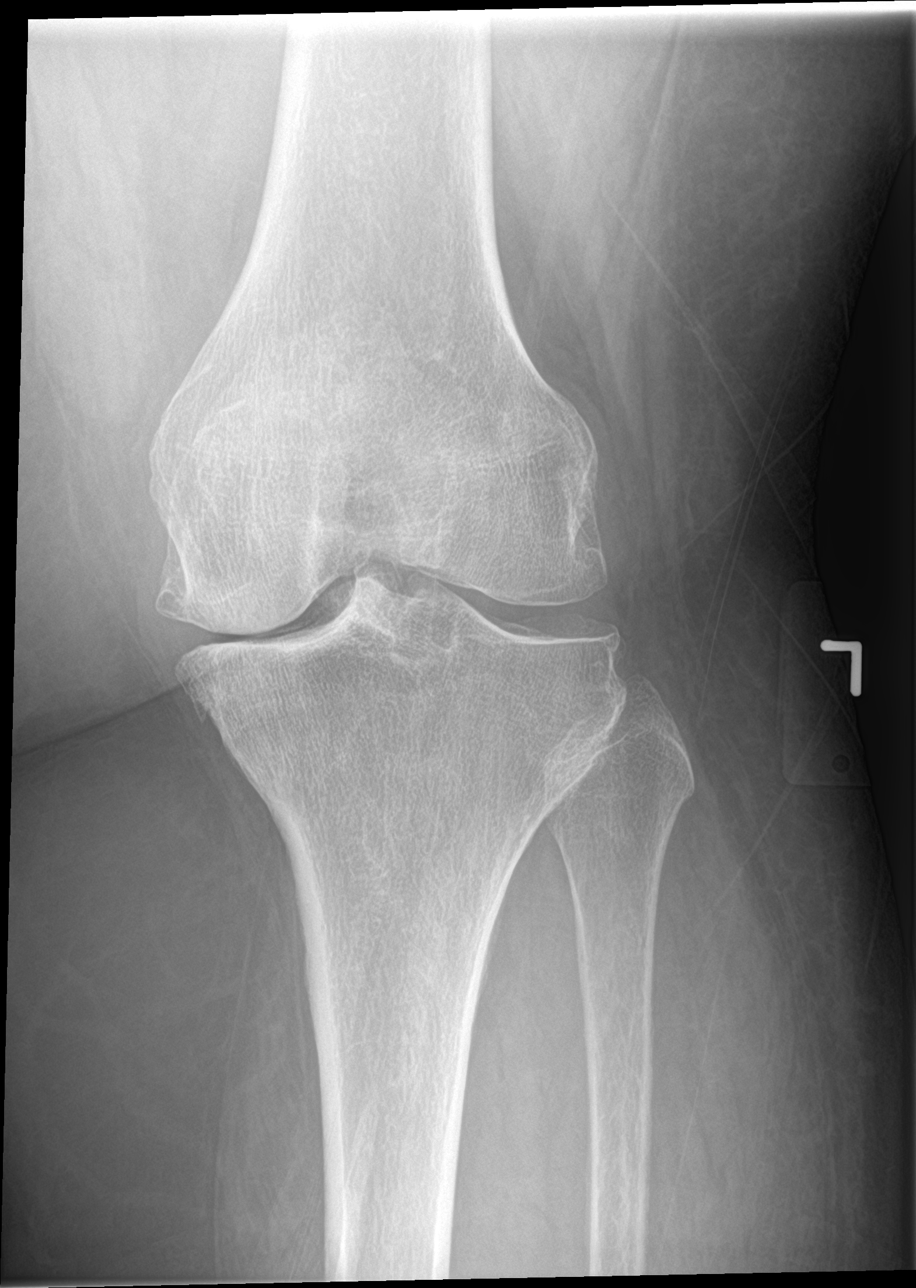

[knee lat]
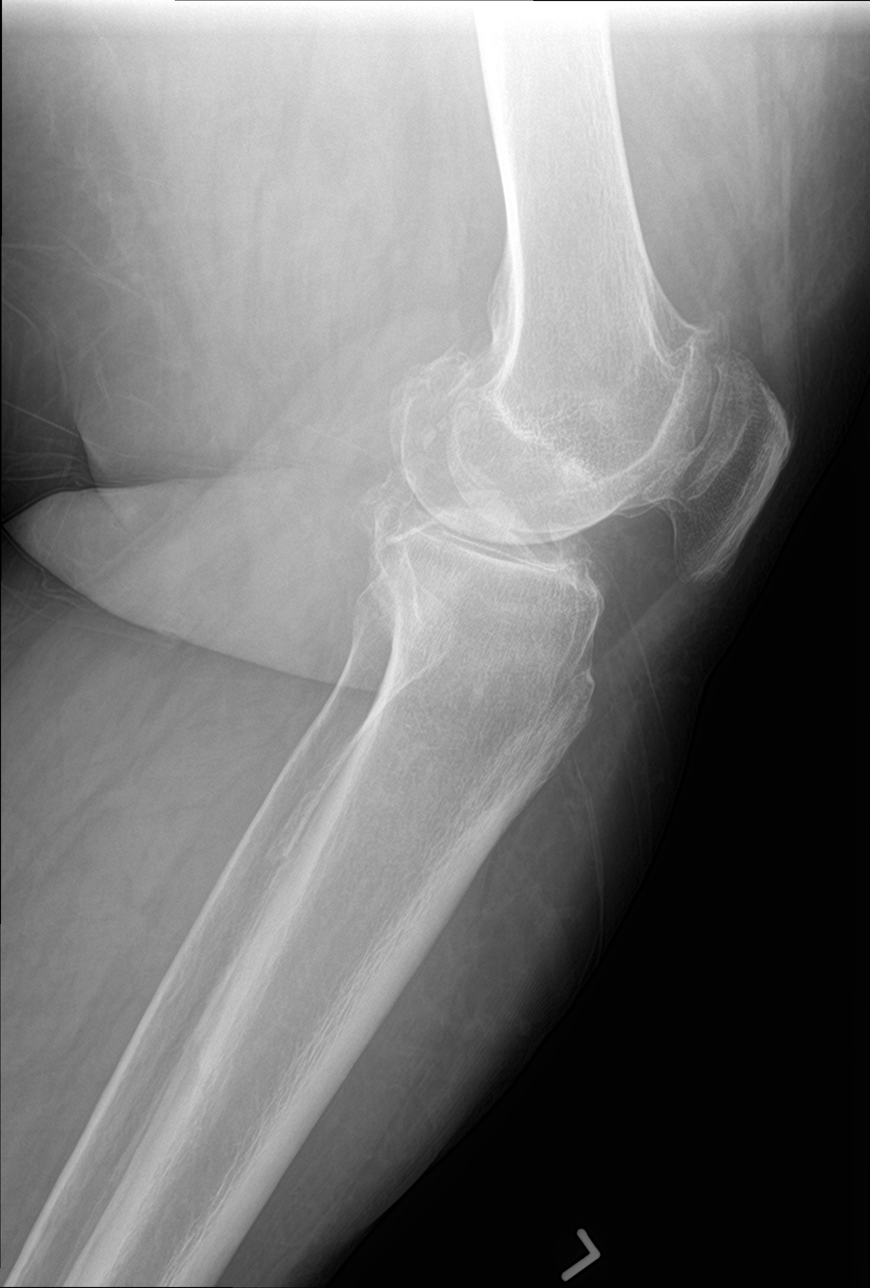

[4 of 4 positions shown; findings below may reference images not displayed]

FINDINGS: Tricompartmental degenerative changes are noted worse in the medial
joint space. No joint effusion is seen. No acute fracture or
dislocation is noted.
IMPRESSION: Degenerative change without acute abnormality.

## 2018-07-12 IMAGING — CR DG KNEE COMPLETE 4+V*R*
4 series · 4 of 4 positions shown · non-contrast
Comparison: None.

CLINICAL DATA: Chronic knee pain right greater than left, history
of remote injury 1 year ago, initial encounter

EXAM:
RIGHT KNEE - COMPLETE 4+ VIEW

[knee ap]
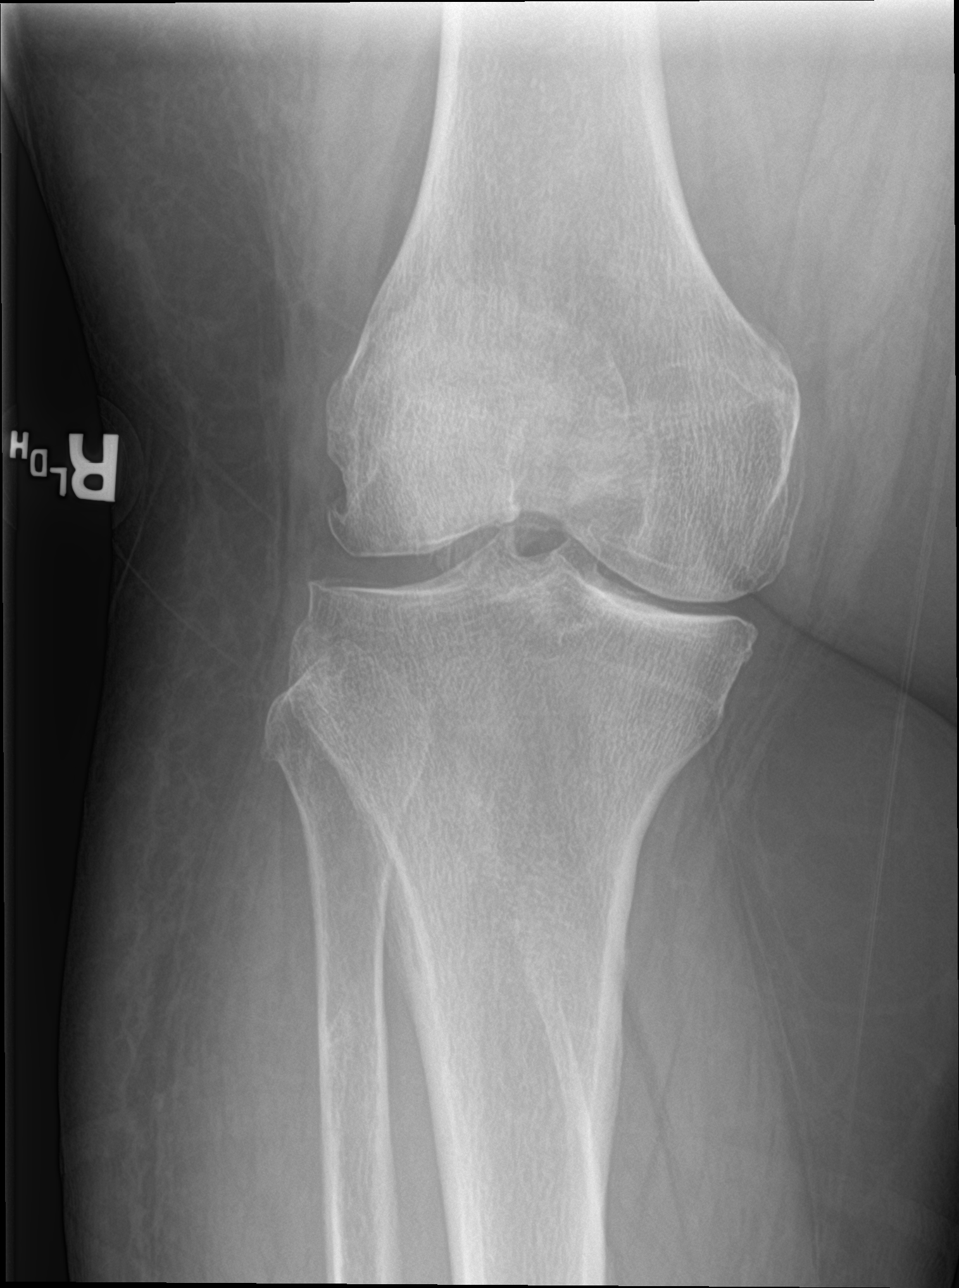

[knee obl (1 of 2)]
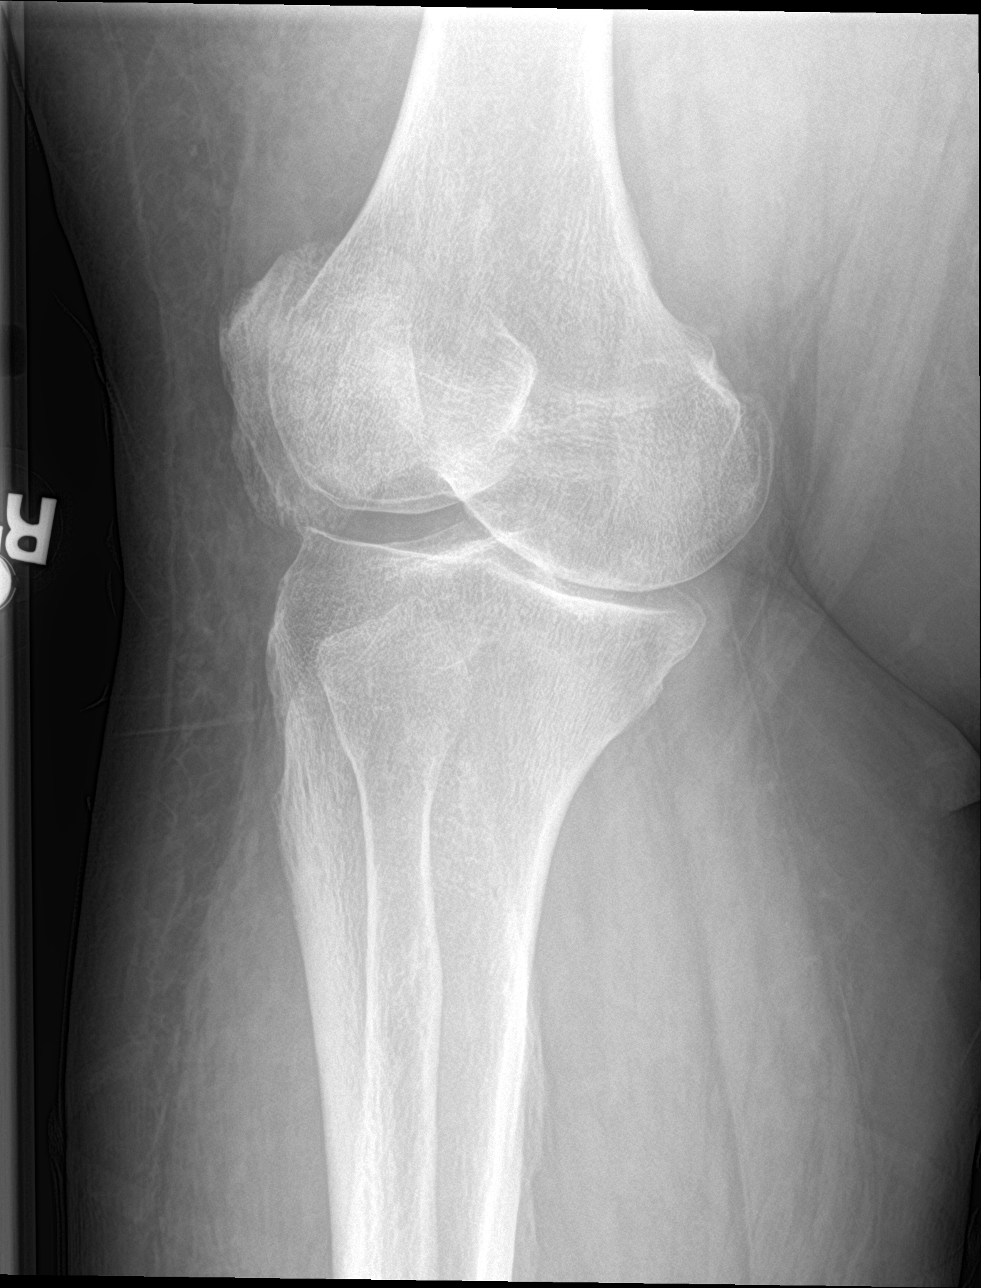

[knee obl (2 of 2)]
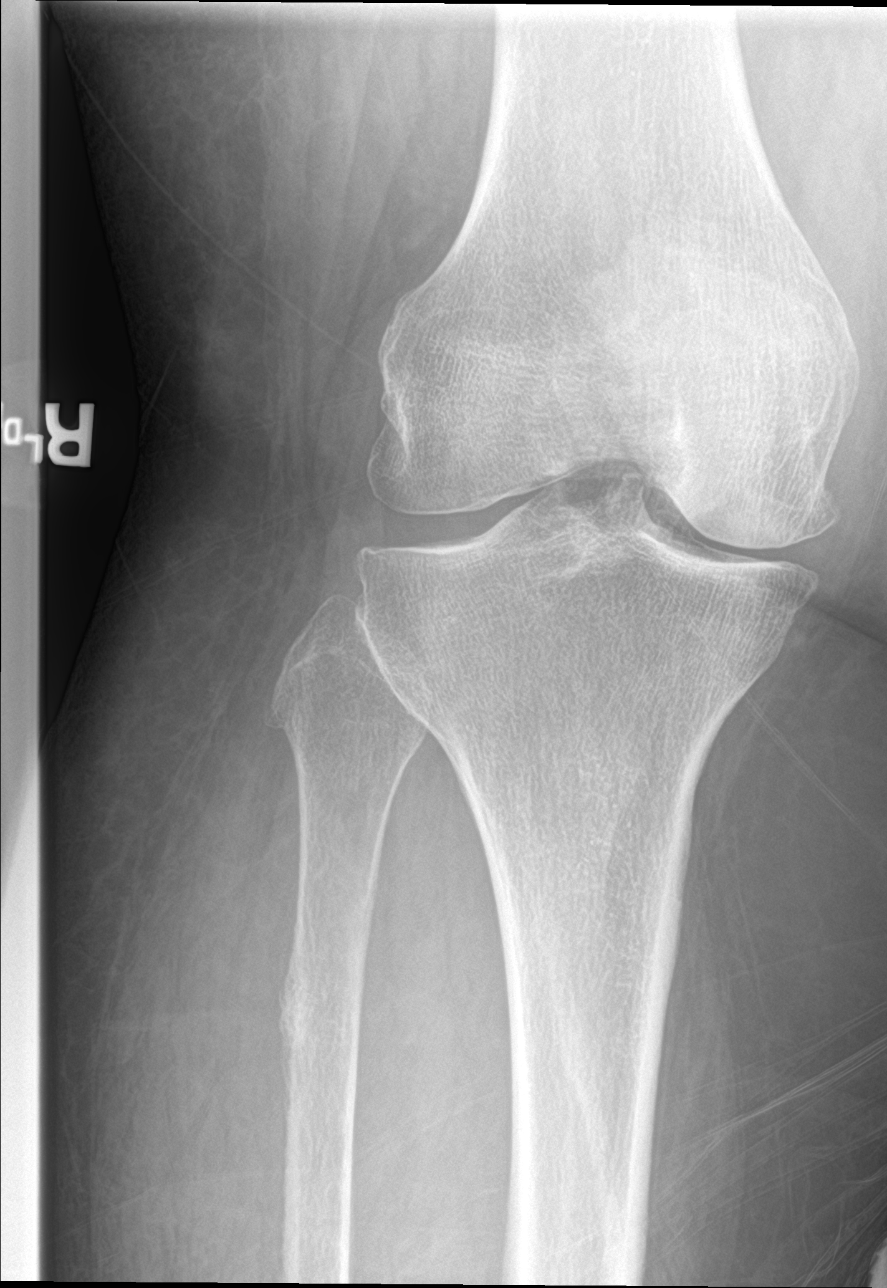

[knee lat]
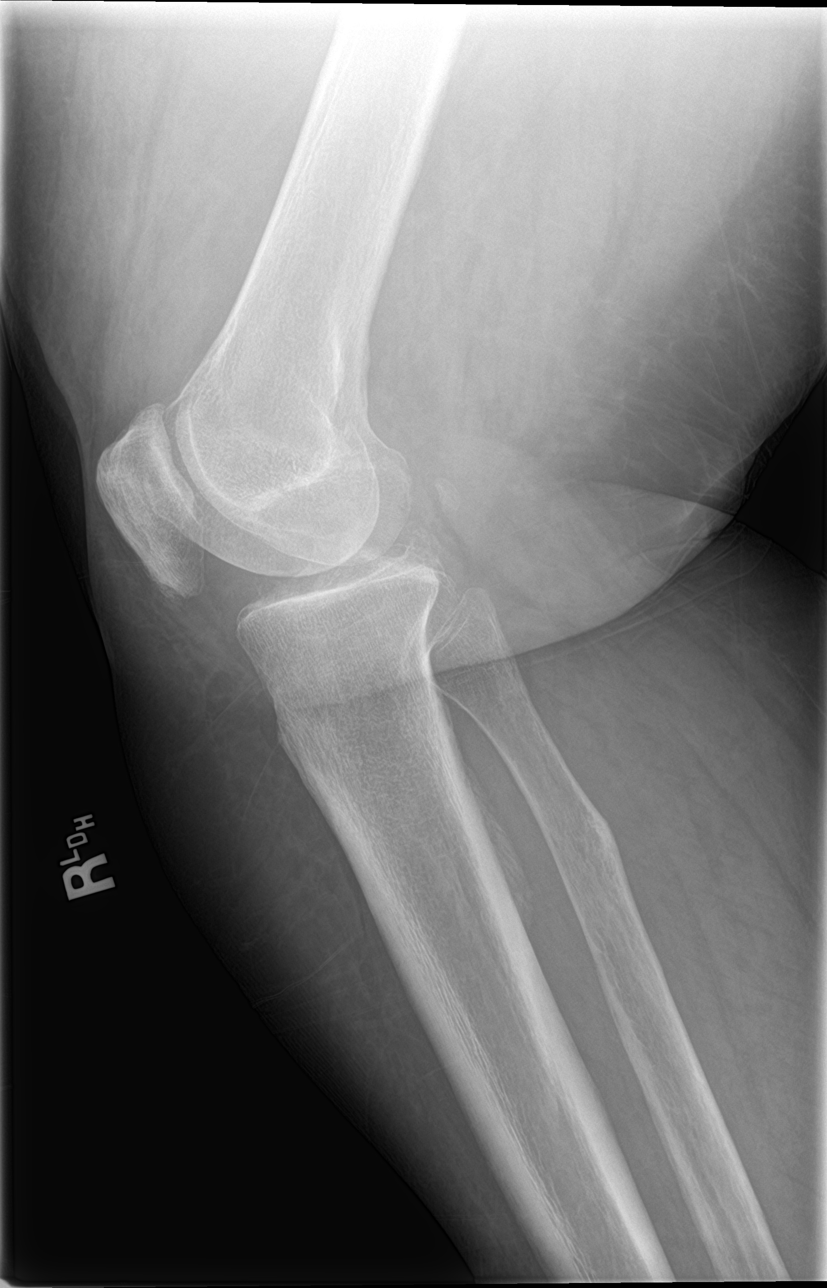

[4 of 4 positions shown; findings below may reference images not displayed]

FINDINGS: Tricompartmental degenerative changes are noted worst in the medial
joint space. No joint effusion is seen. No acute fracture or
dislocation is noted.
IMPRESSION: Degenerative change without acute abnormality.

## 2022-04-15 ENCOUNTER — Ambulatory Visit: Payer: Medicare HMO | Attending: Cardiology | Admitting: Cardiology

## 2022-04-15 ENCOUNTER — Encounter: Payer: Self-pay | Admitting: Cardiology

## 2022-04-15 VITALS — BP 138/106 | HR 86 | Ht 64.0 in | Wt 336.0 lb

## 2022-04-15 DIAGNOSIS — I340 Nonrheumatic mitral (valve) insufficiency: Secondary | ICD-10-CM

## 2022-04-15 DIAGNOSIS — I4819 Other persistent atrial fibrillation: Secondary | ICD-10-CM | POA: Diagnosis not present

## 2022-04-15 DIAGNOSIS — I10 Essential (primary) hypertension: Secondary | ICD-10-CM

## 2022-04-15 NOTE — Progress Notes (Signed)
Cardiology Office Note:    Date:  04/15/2022   ID:  Molly Rivera, DOB August 01, 1960, MRN [id number]  PCP:  Sandrea Hughs, NP   [facility] Providers Cardiologist:  None     Referring MD: Hillery Aldo, MD   Chief Complaint  Patient presents with   New Patient (Initial Visit)    Afib, cardiac Hx     History of Present Illness:    Molly Rivera is a 61 y.o. female with a hx of hypertension, persistent atrial fibrillation s/p DC cardioversion 2016, moderate MR, obesity, OSA on CPAP who presents to establish care.  Previously seen at [facility] from a cardiac perspective.  Medication list includes Coreg 25 mg twice daily, Eliquis 5 mg twice daily for stroke prophylaxis, Cardizem CD 180 mg daily, Lasix 40 mg twice daily.  Echocardiogram 10/2021 obtained at [facility] showed EF over 55%, moderate MR. TEE was being considered for possible rhythm control.    Denies chest pain or palpitations, endorses osteoarthritis, back pain.  Recently saw PCP, Trulicity being considered for both diabetes control and weight loss.  Endorses shortness of breath with exertion  Past Medical History:  Diagnosis Date   A-fib [facility])    a. new onset 05/2015, b. CHADS2VASc => 2 (HTN and sex category); c. echo 05/2015: EF 50-55%, nl wall motion, mild to mod MR, PASP 38 mm Hg   Allergy    Arthritis    Asthma    COPD (chronic obstructive pulmonary disease) (HCC)    Diverticulitis    Hypertension    Mitral regurgitation    a. echo 05/2015: mild to mod MR   Morbid obesity (HCC)    Sleep apnea     Past Surgical History:  Procedure Laterality Date   ABDOMINAL HYSTERECTOMY     CESAREAN SECTION     ELECTROPHYSIOLOGIC STUDY N/A 06/14/2015   Procedure: CARDIOVERSION;  Surgeon: Vesta Mixer, MD;  Location: [facility] ORS;  Service: Cardiovascular;  Laterality: N/A;   HEEL SPUR EXCISION Left    knee surgery     TEE WITHOUT CARDIOVERSION N/A 06/14/2015   Procedure: TRANSESOPHAGEAL ECHOCARDIOGRAM (TEE);   Surgeon: Vesta Mixer, MD;  Location: [facility] ORS;  Service: Cardiovascular;  Laterality: N/A;    Current Medications: Current Meds  Medication Sig   acetaminophen (ACETAMINOPHEN 8 HOUR) 650 MG CR tablet Take 650 mg by mouth every 8 (eight) hours as needed for pain.   albuterol (PROVENTIL HFA;VENTOLIN HFA) 108 (90 Base) MCG/ACT inhaler Inhale 2 puffs into the lungs every 6 (six) hours as needed for wheezing or shortness of breath.   apixaban (ELIQUIS) 5 MG TABS tablet Take 1 tablet (5 mg total) by mouth 2 (two) times daily.   atorvastatin (LIPITOR) 40 MG tablet Take 40 mg by mouth daily.   Calcium Carb-Cholecalciferol (CALCIUM CARBONATE-VITAMIN D3 PO) Take 25 mg by mouth 2 (two) times daily at 10 AM and 5 PM.   carvedilol (COREG) 25 MG tablet Take 25 mg by mouth 2 (two) times daily with a meal.   cetirizine (ZYRTEC) 10 MG tablet Take 10 mg by mouth daily.   cyclobenzaprine (FLEXERIL) 5 MG tablet Take 5 mg by mouth as needed.   diltiazem (CARDIZEM CD) 180 MG 24 hr capsule Take 180 mg by mouth daily.   fluticasone (FLONASE) 50 MCG/ACT nasal spray USE 1 SPRAY(S) IN EACH NOSTRIL ONCE DAILY FOR ALLERGIES   furosemide (LASIX) 40 MG tablet Take 40 mg by mouth 2 (two) times daily.    hydrOXYzine (ATARAX) 50  MG tablet Take 50 mg by mouth as needed.   lidocaine (LIDODERM) 5 % as needed.   potassium chloride SA (K-DUR,KLOR-CON) 20 MEQ tablet Take 10 mEq by mouth daily.   SYMBICORT 160-4.5 MCG/ACT inhaler Inhale 160 each into the lungs 1 day or 1 dose. 2 puffs   [DISCONTINUED] cetirizine (ZYRTEC) 10 MG tablet Take 1 tablet by mouth daily.     Allergies:   Allegra [fexofenadine], Diltiazem hcl, Morphine and related, Other, and Claritin [loratadine]   Social History   Socioeconomic History   Marital status: Single    Spouse name: Not on file   Number of children: Not on file   Years of education: Not on file   Highest education level: Not on file  Occupational History   Not on file  Tobacco Use    Smoking status: Never   Smokeless tobacco: Former  Substance and Sexual Activity   Alcohol use: Yes    Alcohol/week: 0.0 standard drinks of alcohol    Comment: occasional beer   Drug use: No   Sexual activity: Yes    Birth control/protection: Surgical  Other Topics Concern   Not on file  Social History Narrative   ** Merged History Encounter **       Social Determinants of Health   Financial Resource Strain: Not on file  Food Insecurity: Not on file  Transportation Needs: Not on file  Physical Activity: Not on file  Stress: Not on file  Social Connections: Not on file     Family History: The patient's family history includes Diabetes in her mother; Healthy in her son; Hypertension in her mother; Schizophrenia in her mother. She was adopted.  ROS:   Please see the history of present illness.     All other systems reviewed and are negative.  EKGs/Labs/Other Studies Reviewed:    The following studies were reviewed today:   EKG:  EKG is  ordered today.  The ekg ordered today demonstrates atrial fibrillation, heart rate 86  Recent Labs: No results found for requested labs within last 365 days.  Recent Lipid Panel No results found for: "CHOL", "TRIG", "HDL", "CHOLHDL", "VLDL", "LDLCALC", "LDLDIRECT"   Risk Assessment/Calculations:          Physical Exam:    VS:  BP (!) 138/106 (BP Location: Left Arm, Patient Position: Sitting, Cuff Size: Large)   Pulse 86   Ht 5\' 4"  (1.626 m)   Wt (!) 336 lb (152.4 kg)   SpO2 (!) 86%   BMI 57.67 kg/m     Wt Readings from Last 3 Encounters:  04/15/22 (!) 336 lb (152.4 kg)  04/13/18 (!) 339 lb 15.2 oz (154.2 kg)  04/04/18 (!) 340 lb (154.2 kg)     GEN:  Well nourished, well developed in no acute distress HEENT: Normal NECK: No JVD; No carotid bruits CARDIAC: Irregular irregular, no murmurs RESPIRATORY:  Clear to auscultation without rales, wheezing or rhonchi  ABDOMEN: Soft, non-tender, non-distended MUSCULOSKELETAL:   No edema; No deformity  SKIN: Warm and dry NEUROLOGIC:  Alert and oriented x 3 PSYCHIATRIC:  Normal affect   ASSESSMENT:    1. Persistent atrial fibrillation ([place])   2. Mitral valve insufficiency, unspecified etiology   3. Morbid obesity ([place])   4. Primary hypertension    PLAN:    In order of problems listed above:  Persistent atrial fibrillation, heart rate controlled, continue Coreg 25 twice daily, Cardizem CD1 80 mg daily, Eliquis 5 mg twice daily.  Do not think  patient is a candidate for rhythm control due to morbid obesity, OSA.  She is otherwise asymptomatic. Moderate MR, repeat echo in 6 months to evaluate any change in severity. Morbid obesity, low-calorie diet, weight loss emphasized.  Planning on starting Trulicity per PCP.  Morbid obesity likely contributing to dyspnea. Hypertension, BP slightly elevated today, titrate diltiazem if elevated at follow-up visit.     Follow-up after echo.  Medication Adjustments/Labs and Tests Ordered: Current medicines are reviewed at length with the patient today.  Concerns regarding medicines are outlined above.  Orders Placed This Encounter  Procedures   Ambulatory referral to Cardiac Electrophysiology   EKG 12-Lead   ECHOCARDIOGRAM COMPLETE   No orders of the defined types were placed in this encounter.   Patient Instructions  Medication Instructions:  Your physician recommends that you continue on your current medications as directed. Please refer to the Current Medication list given to you today.  *If you need a refill on your cardiac medications before your next appointment, please call your pharmacy*   Lab Work: None ordered If you have labs (blood work) drawn today and your tests are completely normal, you will receive your results only by: MyChart Message (if you have MyChart) OR A paper copy in the mail If you have any lab test that is abnormal or we need to change your treatment, we will call you to review the  results.   Testing/Procedures: Your physician has requested that you have an echocardiogram. Echocardiography is a painless test that uses sound waves to create images of your heart. It provides your doctor with information about the size and shape of your heart and how well your heart's Karpf and valves are working. This procedure takes approximately one hour. There are no restrictions for this procedure.    Follow-Up: At [facility], you and your health needs are our priority.  As part of our continuing mission to provide you with exceptional heart care, we have created designated Provider Care Teams.  These Care Teams include your primary Cardiologist (physician) and Advanced Practice Providers (APPs -  Physician Assistants and Nurse Practitioners) who all work together to provide you with the care you need, when you need it.  We recommend signing up for the patient portal called "MyChart".  Sign up information is provided on this After Visit Summary.  MyChart is used to connect with patients for Virtual Visits (Telemedicine).  Patients are able to view lab/test results, encounter notes, upcoming appointments, etc.  Non-urgent messages can be sent to your provider as well.   To learn more about what you can do with MyChart, go to [email or web address].    Your next appointment:   6 week(s)  You have been referred to EP Dr. Lalla Brothers. To discuss AFIB rhythm  control  The format for your next appointment:   In Person  Provider:   Debbe Odea, MD     Other Instructions N/A  Important Information About Sugar         Signed, Debbe Odea, MD  04/15/2022 3:52 PM    [facility]

## 2022-04-15 NOTE — Patient Instructions (Signed)
Medication Instructions:  Your physician recommends that you continue on your current medications as directed. Please refer to the Current Medication list given to you today.  *If you need a refill on your cardiac medications before your next appointment, please call your pharmacy*   Lab Work: None ordered If you have labs (blood work) drawn today and your tests are completely normal, you will receive your results only by: [place] (if you have MyChart) OR A paper copy in the mail If you have any lab test that is abnormal or we need to change your treatment, we will call you to review the results.   Testing/Procedures: Your physician has requested that you have an echocardiogram. Echocardiography is a painless test that uses sound waves to create images of your heart. It provides your doctor with information about the size and shape of your heart and how well your heart's Tirey and valves are working. This procedure takes approximately one hour. There are no restrictions for this procedure.    Follow-Up: At [facility], you and your health needs are our priority.  As part of our continuing mission to provide you with exceptional heart care, we have created designated Provider Care Teams.  These Care Teams include your primary Cardiologist (physician) and Advanced Practice Providers (APPs -  Physician Assistants and Nurse Practitioners) who all work together to provide you with the care you need, when you need it.  We recommend signing up for the patient portal called "MyChart".  Sign up information is provided on this After Visit Summary.  MyChart is used to connect with patients for Virtual Visits (Telemedicine).  Patients are able to view lab/test results, encounter notes, upcoming appointments, etc.  Non-urgent messages can be sent to your provider as well.   To learn more about what you can do with MyChart, go to [email or web address].    Your next appointment:   6  week(s)  You have been referred to EP Dr. Quentin Ore. To discuss AFIB rhythm  control  The format for your next appointment:   In Person  Provider:   Kate Sable, MD     Other Instructions N/A  Important Information About Sugar

## 2022-04-16 NOTE — Progress Notes (Unsigned)
Electrophysiology Office Note:    Date:  04/17/2022   ID:  [name], DOB Jul 07, 1961, MRN [id number]  PCP:  Sandrea Hughs, NP  [facility] Cardiologist:  None  [facility] Electrophysiologist:  Lanier Prude, MD   Referring MD: Debbe Odea, MD   Chief Complaint: AF  History of Present Illness:    Molly Rivera is a 61 y.o. female who presents for an evaluation of AF at the request of Dr Azucena Cecil. Their medical history includes permanent AF, HTN, moderate MR, OSA on CPAP.   The patient saw Dr Azucena Cecil 04/15/2022. She takes eliquis for stroke prophylaxis.   She takes Eliquis for stroke prophylaxis.  She does not miss doses.  No problems of bleeding.     Past Medical History:  Diagnosis Date   A-fib [facility]    a. new onset 05/2015, b. CHADS2VASc => 2 (HTN and sex category); c. echo 05/2015: EF 50-55%, nl wall motion, mild to mod MR, PASP 38 mm Hg   Allergy    Arthritis    Asthma    COPD (chronic obstructive pulmonary disease) (HCC)    Diverticulitis    Hypertension    Mitral regurgitation    a. echo 05/2015: mild to mod MR   Morbid obesity (HCC)    Sleep apnea     Past Surgical History:  Procedure Laterality Date   ABDOMINAL HYSTERECTOMY     CESAREAN SECTION     ELECTROPHYSIOLOGIC STUDY N/A 06/14/2015   Procedure: CARDIOVERSION;  Surgeon: Vesta Mixer, MD;  Location: [facility] ORS;  Service: Cardiovascular;  Laterality: N/A;   HEEL SPUR EXCISION Left    knee surgery     TEE WITHOUT CARDIOVERSION N/A 06/14/2015   Procedure: TRANSESOPHAGEAL ECHOCARDIOGRAM (TEE);  Surgeon: Vesta Mixer, MD;  Location: [facility] ORS;  Service: Cardiovascular;  Laterality: N/A;    Current Medications: Current Meds  Medication Sig   acetaminophen (ACETAMINOPHEN 8 HOUR) 650 MG CR tablet Take 650 mg by mouth every 8 (eight) hours as needed for pain.   albuterol (PROVENTIL HFA;VENTOLIN HFA) 108 (90 Base) MCG/ACT inhaler Inhale 2 puffs into the lungs every 6  (six) hours as needed for wheezing or shortness of breath.   apixaban (ELIQUIS) 5 MG TABS tablet Take 1 tablet (5 mg total) by mouth 2 (two) times daily.   atorvastatin (LIPITOR) 40 MG tablet Take 40 mg by mouth daily.   Calcium Carb-Cholecalciferol (CALCIUM CARBONATE-VITAMIN D3 PO) Take 25 mg by mouth 2 (two) times daily at 10 AM and 5 PM.   carvedilol (COREG) 25 MG tablet Take 25 mg by mouth 2 (two) times daily with a meal.   cetirizine (ZYRTEC) 10 MG tablet Take 10 mg by mouth daily.   cyclobenzaprine (FLEXERIL) 5 MG tablet Take 5 mg by mouth as needed.   diltiazem (CARDIZEM CD) 180 MG 24 hr capsule Take 180 mg by mouth daily.   fluticasone (FLONASE) 50 MCG/ACT nasal spray USE 1 SPRAY(S) IN EACH NOSTRIL ONCE DAILY FOR ALLERGIES   furosemide (LASIX) 40 MG tablet Take 40 mg by mouth 2 (two) times daily.    hydrOXYzine (ATARAX) 50 MG tablet Take 50 mg by mouth as needed.   lidocaine (LIDODERM) 5 % as needed.   metoprolol tartrate 75 MG TABS Take 150 mg by mouth every 12 (twelve) hours.   oxyCODONE-acetaminophen (PERCOCET/ROXICET) 5-325 MG tablet Take 1 tablet by mouth every 4 (four) hours as needed for severe pain.   potassium chloride (KLOR-CON) 10 MEQ tablet Take  10 mEq by mouth daily.   potassium chloride SA (K-DUR,KLOR-CON) 20 MEQ tablet Take 10 mEq by mouth daily.   Pseudoephedrine HCl, Deter, 30 MG TABA Take 1 capsule by mouth daily.   Pseudoephedrine HCl, Deter, 30 MG TABA Inhale 2 puffs into the lungs 2 (two) times daily.   SYMBICORT 160-4.5 MCG/ACT inhaler Inhale 160 each into the lungs 1 day or 1 dose. 2 puffs   triamterene-hydrochlorothiazide (DYAZIDE) 37.5-25 MG capsule Take 1 capsule by mouth daily. Half tablet     Allergies:   Allegra [fexofenadine], Diltiazem hcl, Morphine and related, Other, and Claritin [loratadine]   Social History   Socioeconomic History   Marital status: Single    Spouse name: Not on file   Number of children: Not on file   Years of education: Not on  file   Highest education level: Not on file  Occupational History   Not on file  Tobacco Use   Smoking status: Never   Smokeless tobacco: Former    Quit date: 02/06/2022  Substance and Sexual Activity   Alcohol use: Yes    Alcohol/week: 0.0 standard drinks of alcohol    Comment: occasional beer   Drug use: No   Sexual activity: Yes    Birth control/protection: Surgical  Other Topics Concern   Not on file  Social History Narrative   ** Merged History Encounter **       Social Determinants of Health   Financial Resource Strain: Not on file  Food Insecurity: Not on file  Transportation Needs: Not on file  Physical Activity: Not on file  Stress: Not on file  Social Connections: Not on file     Family History: The patient's family history includes Diabetes in her mother; Healthy in her son; Hypertension in her mother; Schizophrenia in her mother. She was adopted.  ROS:   Please see the history of present illness.    All other systems reviewed and are negative.  EKGs/Labs/Other Studies Reviewed:    The following studies were reviewed today:  Last sinus rhythm EKGs August 26, 2012.  EKGs from 2016, 2018, 2019 and 2023 show atrial fibrillation.   Recent Labs: No results found for requested labs within last 365 days.  Recent Lipid Panel No results found for: "CHOL", "TRIG", "HDL", "CHOLHDL", "VLDL", "LDLCALC", "LDLDIRECT"  Physical Exam:    VS:  BP 118/72 (BP Location: Left Arm, Patient Position: Sitting, Cuff Size: Normal)   Pulse 99   Ht 5\' 4"  (1.626 m)   Wt (!) 336 lb 8 oz (152.6 kg)   SpO2 99%   BMI 57.76 kg/m     Wt Readings from Last 3 Encounters:  04/17/22 (!) 336 lb 8 oz (152.6 kg)  04/15/22 (!) 336 lb (152.4 kg)  04/13/18 (!) 339 lb 15.2 oz (154.2 kg)     GEN:  Well nourished, well developed in no acute distress.  Morbidly obese HEENT: Normal NECK: No JVD; No carotid bruits LYMPHATICS: No lymphadenopathy CARDIAC: Irregularly irregular, no  murmurs, rubs, gallops RESPIRATORY:  Clear to auscultation without rales, wheezing or rhonchi  ABDOMEN: Soft, non-tender, non-distended MUSCULOSKELETAL:  No edema; No deformity  SKIN: Warm and dry NEUROLOGIC:  Alert and oriented x 3 PSYCHIATRIC:  Normal affect       ASSESSMENT:    1. Permanent atrial fibrillation (HCC)   2. Morbid obesity (HCC)   3. Primary hypertension    PLAN:    In order of problems listed above:  #Permanent AF She has been  in atrial fibrillation since at least 2016. Cont eliquis, dilt, metoprolol Not a candidate for invasive EP procedures due to body habitus.  #Morbid obesity Likely contributing to her chronic shortness of breath  #Hypertension Controlled.  Continue current medication regimen.  #Obstructive sleep apnea CPAP compliance encouraged.  She will follow-up with EP on an as-needed basis. Note sent to Dr. Garen Lah.  Medication Adjustments/Labs and Tests Ordered: Current medicines are reviewed at length with the patient today.  Concerns regarding medicines are outlined above.  No orders of the defined types were placed in this encounter.  No orders of the defined types were placed in this encounter.    Signed, Hilton Cork. Quentin Ore, MD, [facility], [facility] 04/17/2022 10:03 AM    Electrophysiology [facility]

## 2022-04-17 ENCOUNTER — Encounter: Payer: Self-pay | Admitting: Cardiology

## 2022-04-17 ENCOUNTER — Ambulatory Visit: Payer: Medicare HMO | Attending: Cardiology | Admitting: Cardiology

## 2022-04-17 VITALS — BP 118/72 | HR 99 | Ht 64.0 in | Wt 336.5 lb

## 2022-04-17 DIAGNOSIS — I4821 Permanent atrial fibrillation: Secondary | ICD-10-CM | POA: Diagnosis not present

## 2022-04-17 DIAGNOSIS — I4819 Other persistent atrial fibrillation: Secondary | ICD-10-CM

## 2022-04-17 DIAGNOSIS — I10 Essential (primary) hypertension: Secondary | ICD-10-CM

## 2022-04-17 NOTE — Patient Instructions (Signed)
Medication Instructions:  No changes *If you need a refill on your cardiac medications before your next appointment, please call your pharmacy*   Lab Work: none If you have labs (blood work) drawn today and your tests are completely normal, you will receive your results only by: [place] (if you have MyChart) OR A paper copy in the mail If you have any lab test that is abnormal or we need to change your treatment, we will call you to review the results.   Testing/Procedures: none   Follow-Up: At [facility], you and your health needs are our priority.  As part of our continuing mission to provide you with exceptional heart care, we have created designated Provider Care Teams.  These Care Teams include your primary Cardiologist (physician) and Advanced Practice Providers (APPs -  Physician Assistants and Nurse Practitioners) who all work together to provide you with the care you need, when you need it.  We recommend signing up for the patient portal called "MyChart".  Sign up information is provided on this After Visit Summary.  MyChart is used to connect with patients for Virtual Visits (Telemedicine).  Patients are able to view lab/test results, encounter notes, upcoming appointments, etc.  Non-urgent messages can be sent to your provider as well.   To learn more about what you can do with MyChart, go to [email or web address].    Your next appointment:   As needed    The format for your next appointment:   In Person  Provider:   Lars Mage, MD    Other Instructions None   Important Information About Sugar

## 2022-05-21 ENCOUNTER — Ambulatory Visit: Payer: Medicare HMO | Attending: Cardiology

## 2022-05-21 DIAGNOSIS — I340 Nonrheumatic mitral (valve) insufficiency: Secondary | ICD-10-CM

## 2022-05-21 LAB — ECHOCARDIOGRAM COMPLETE
AR max vel: 2.57 cm2
AV Area VTI: 2.94 cm2
AV Area mean vel: 2.75 cm2
AV Mean grad: 2 mmHg
AV Peak grad: 4.5 mmHg
Ao pk vel: 1.06 m/s
S' Lateral: 2.8 cm

## 2022-05-27 ENCOUNTER — Ambulatory Visit: Payer: Medicare HMO | Attending: Cardiology | Admitting: Cardiology

## 2022-05-27 ENCOUNTER — Encounter: Payer: Self-pay | Admitting: Cardiology

## 2022-05-27 VITALS — BP 144/100 | HR 82 | Ht 64.0 in | Wt 337.0 lb

## 2022-05-27 DIAGNOSIS — I4821 Permanent atrial fibrillation: Secondary | ICD-10-CM | POA: Diagnosis not present

## 2022-05-27 DIAGNOSIS — I10 Essential (primary) hypertension: Secondary | ICD-10-CM

## 2022-05-27 DIAGNOSIS — I340 Nonrheumatic mitral (valve) insufficiency: Secondary | ICD-10-CM

## 2022-05-27 MED ORDER — DILTIAZEM HCL ER COATED BEADS 240 MG PO CP24: 240.0000 mg | ORAL_CAPSULE | Freq: Every day | ORAL | 1 refills | Status: DC

## 2022-05-27 NOTE — Patient Instructions (Signed)
Medication Instructions:   Your physician has recommended you make the following change in your medication:     INCREASE your Diltiazem (Cardizem CD) to 240 MG once a day.  *If you need a refill on your cardiac medications before your next appointment, please call your pharmacy*     Follow-Up: At [facility], you and your health needs are our priority.  As part of our continuing mission to provide you with exceptional heart care, we have created designated Provider Care Teams.  These Care Teams include your primary Cardiologist (physician) and Advanced Practice Providers (APPs -  Physician Assistants and Nurse Practitioners) who all work together to provide you with the care you need, when you need it.  We recommend signing up for the patient portal called "MyChart".  Sign up information is provided on this After Visit Summary.  MyChart is used to connect with patients for Virtual Visits (Telemedicine).  Patients are able to view lab/test results, encounter notes, upcoming appointments, etc.  Non-urgent messages can be sent to your provider as well.   To learn more about what you can do with MyChart, go to [email or web address].    Your next appointment:   2 month(s)  The format for your next appointment:   In Person  Provider:   Kate Sable, MD    Other Instructions   Important Information About Sugar

## 2022-05-27 NOTE — Progress Notes (Signed)
Cardiology Office Note:    Date:  05/27/2022   ID:  Molly Rivera, DOB 01/22/1961, MRN [id number]  PCP:  Freddy Finner, NP   [place] Providers Cardiologist:  None Electrophysiologist:  Vickie Epley, MD     Referring MD: Freddy Finner, NP   Chief Complaint  Patient presents with   Follow-up    6 week follow up,  tired a lot    History of Present Illness:    Molly Rivera is a 61 y.o. female with a hx of hypertension, permanent atrial fibrillation s/p DC cardioversion 2016, moderate MR, obesity, OSA on CPAP who presents for follow-up.  Previously seen due to atrial fibrillation and hypertension.  Previous echo showed moderate MR, repeat echocardiogram obtained to evaluate any progression of valvular disease.  Denies palpitations, compliant with Cardizem and Eliquis as prescribed.  Denies any bleeding issues.  Endorses eating salty diet yesterday.  Has noticed some leg edema, takes Lasix 40 mg daily.  Prior notes Echocardiogram 10/2021 obtained at [facility] showed EF over 55%, moderate MR. TEE was being considered for possible rhythm control.     Past Medical History:  Diagnosis Date   A-fib [facility] [place])    a. new onset 05/2015, b. CHADS2VASc => 2 (HTN and sex category); c. echo 05/2015: EF 50-55%, nl wall motion, mild to mod MR, PASP 38 mm Hg   Allergy    Arthritis    Asthma    COPD (chronic obstructive pulmonary disease) (HCC)    Diverticulitis    Hypertension    Mitral regurgitation    a. echo 05/2015: mild to mod MR   Morbid obesity ([place])    Sleep apnea     Past Surgical History:  Procedure Laterality Date   ABDOMINAL HYSTERECTOMY     CESAREAN SECTION     ELECTROPHYSIOLOGIC STUDY N/A 06/14/2015   Procedure: CARDIOVERSION;  Surgeon: Thayer Headings, MD;  Location: [facility] ORS;  Service: Cardiovascular;  Laterality: N/A;   HEEL SPUR EXCISION Left    knee surgery     TEE WITHOUT CARDIOVERSION N/A 06/14/2015   Procedure: TRANSESOPHAGEAL ECHOCARDIOGRAM  (TEE);  Surgeon: Thayer Headings, MD;  Location: [facility] ORS;  Service: Cardiovascular;  Laterality: N/A;    Current Medications: Current Meds  Medication Sig   acetaminophen (ACETAMINOPHEN 8 HOUR) 650 MG CR tablet Take 650 mg by mouth every 8 (eight) hours as needed for pain.   albuterol (PROVENTIL HFA;VENTOLIN HFA) 108 (90 Base) MCG/ACT inhaler Inhale 2 puffs into the lungs every 6 (six) hours as needed for wheezing or shortness of breath.   apixaban (ELIQUIS) 5 MG TABS tablet Take 1 tablet (5 mg total) by mouth 2 (two) times daily.   atorvastatin (LIPITOR) 40 MG tablet Take 40 mg by mouth daily.   Calcium Carb-Cholecalciferol (CALCIUM CARBONATE-VITAMIN D3 PO) Take 25 mg by mouth 2 (two) times daily at 10 AM and 5 PM.   carvedilol (COREG) 25 MG tablet Take 25 mg by mouth 2 (two) times daily with a meal.   cetirizine (ZYRTEC) 10 MG tablet Take 10 mg by mouth daily.   cyclobenzaprine (FLEXERIL) 5 MG tablet Take 5 mg by mouth as needed.   Dulaglutide (TRULICITY) 8.29 HB/7.1IR SOPN 0.75 mg.   fluticasone (FLONASE) 50 MCG/ACT nasal spray USE 1 SPRAY(S) IN EACH NOSTRIL ONCE DAILY FOR ALLERGIES   furosemide (LASIX) 40 MG tablet Take 40 mg by mouth 2 (two) times daily.    hydrOXYzine (ATARAX) 50 MG tablet Take 50 mg by mouth as  needed.   lidocaine (LIDODERM) 5 % as needed.   metoprolol tartrate 75 MG TABS Take 150 mg by mouth every 12 (twelve) hours.   oxyCODONE-acetaminophen (PERCOCET/ROXICET) 5-325 MG tablet Take 1 tablet by mouth every 4 (four) hours as needed for severe pain.   potassium chloride (KLOR-CON) 10 MEQ tablet Take 10 mEq by mouth daily.   potassium chloride SA (K-DUR,KLOR-CON) 20 MEQ tablet Take 10 mEq by mouth daily.   Pseudoephedrine HCl, Deter, 30 MG TABA Take 1 capsule by mouth daily.   SYMBICORT 160-4.5 MCG/ACT inhaler Inhale 160 each into the lungs 1 day or 1 dose. 2 puffs   tiZANidine (ZANAFLEX) 4 MG tablet Take 1 tablet by mouth once. Half tablet evening    triamterene-hydrochlorothiazide (DYAZIDE) 37.5-25 MG capsule Take 1 capsule by mouth daily. Half tablet   [DISCONTINUED] diltiazem (CARDIZEM CD) 180 MG 24 hr capsule Take 180 mg by mouth daily.     Allergies:   Allegra [fexofenadine], Diltiazem hcl, Morphine and related, Other, and Claritin [loratadine]   Social History   Socioeconomic History   Marital status: Single    Spouse name: Not on file   Number of children: Not on file   Years of education: Not on file   Highest education level: Not on file  Occupational History   Not on file  Tobacco Use   Smoking status: Never   Smokeless tobacco: Former    Quit date: 02/06/2022  Substance and Sexual Activity   Alcohol use: Yes    Alcohol/week: 0.0 standard drinks of alcohol    Comment: occasional beer   Drug use: No   Sexual activity: Yes    Birth control/protection: Surgical  Other Topics Concern   Not on file  Social History Narrative   ** Merged History Encounter **       Social Determinants of Health   Financial Resource Strain: Not on file  Food Insecurity: Not on file  Transportation Needs: Not on file  Physical Activity: Not on file  Stress: Not on file  Social Connections: Not on file     Family History: The patient's family history includes Diabetes in her mother; Healthy in her son; Hypertension in her mother; Schizophrenia in her mother. She was adopted.  ROS:   Please see the history of present illness.     All other systems reviewed and are negative.  EKGs/Labs/Other Studies Reviewed:    The following studies were reviewed today:   EKG:  EKG is  ordered today.  The ekg ordered today demonstrates atrial fibrillation, heart rate 86  Recent Labs: No results found for requested labs within last 365 days.  Recent Lipid Panel No results found for: "CHOL", "TRIG", "HDL", "CHOLHDL", "VLDL", "LDLCALC", "LDLDIRECT"   Risk Assessment/Calculations:          Physical Exam:    VS:  BP (!) 144/100 (BP  Location: Left Arm, Patient Position: Sitting, Cuff Size: Large)   Pulse 82   Ht 5\' 4"  (1.626 m)   Wt (!) 337 lb (152.9 kg)   SpO2 98%   BMI 57.85 kg/m     Wt Readings from Last 3 Encounters:  05/27/22 (!) 337 lb (152.9 kg)  04/17/22 (!) 336 lb 8 oz (152.6 kg)  04/15/22 (!) 336 lb (152.4 kg)     GEN:  Well nourished, well developed in no acute distress HEENT: Normal NECK: No JVD; No carotid bruits CARDIAC: Irregular irregular, no murmurs RESPIRATORY:  Clear to auscultation without rales, wheezing or rhonchi  ABDOMEN: Soft, non-tender, non-distended MUSCULOSKELETAL: Trace edema; No deformity  SKIN: Warm and dry NEUROLOGIC:  Alert and oriented x 3 PSYCHIATRIC:  Normal affect   ASSESSMENT:    1. Permanent atrial fibrillation (HCC)   2. Mitral valve insufficiency, unspecified etiology   3. Primary hypertension   4. Morbid obesity (HCC)    PLAN:    In order of problems listed above:  Persistent atrial fibrillation, heart rate controlled, continue Coreg 25 twice daily, Cardizem CD, Eliquis 5 mg twice daily.  Do not think patient is a candidate for rhythm control due to morbid obesity, OSA.  She is otherwise asymptomatic. Moderate MR, echo shows mild to moderate MR, no significant change from prior. Hypertension, BP elevated, increase Cardizem to 240 mg daily.  Continue HCTZ, triamterene, Lasix 40 mg daily check BMP in 1 week. Morbid obesity, morbid obesity     Follow-up in 2 to 3 months  Medication Adjustments/Labs and Tests Ordered: Current medicines are reviewed at length with the patient today.  Concerns regarding medicines are outlined above.  Orders Placed This Encounter  Procedures   EKG 12-Lead   Meds ordered this encounter  Medications   diltiazem (CARDIZEM CD) 240 MG 24 hr capsule    Sig: Take 1 capsule (240 mg total) by mouth daily.    Dispense:  90 capsule    Refill:  1    Patient Instructions  Medication Instructions:   Your physician has recommended  you make the following change in your medication:     INCREASE your Diltiazem (Cardizem CD) to 240 MG once a day.  *If you need a refill on your cardiac medications before your next appointment, please call your pharmacy*     Follow-Up: At [facility], you and your health needs are our priority.  As part of our continuing mission to provide you with exceptional heart care, we have created designated Provider Care Teams.  These Care Teams include your primary Cardiologist (physician) and Advanced Practice Providers (APPs -  Physician Assistants and Nurse Practitioners) who all work together to provide you with the care you need, when you need it.  We recommend signing up for the patient portal called "MyChart".  Sign up information is provided on this After Visit Summary.  MyChart is used to connect with patients for Virtual Visits (Telemedicine).  Patients are able to view lab/test results, encounter notes, upcoming appointments, etc.  Non-urgent messages can be sent to your provider as well.   To learn more about what you can do with MyChart, go to [email or web address].    Your next appointment:   2 month(s)  The format for your next appointment:   In Person  Provider:   Debbe Odea, MD    Other Instructions   Important Information About Sugar         Signed, Debbe Odea, MD  05/27/2022 12:18 PM    [facility]

## 2022-06-06 ENCOUNTER — Encounter: Payer: Self-pay | Admitting: Podiatry

## 2022-06-06 ENCOUNTER — Ambulatory Visit: Payer: Medicare HMO | Admitting: Podiatry

## 2022-06-06 DIAGNOSIS — L600 Ingrowing nail: Secondary | ICD-10-CM

## 2022-06-06 DIAGNOSIS — Q666 Other congenital valgus deformities of feet: Secondary | ICD-10-CM

## 2022-06-06 NOTE — Progress Notes (Signed)
Subjective:  Patient ID: Molly Rivera, female    DOB: Nov 05, 1960,  MRN: [id number]  No chief complaint on file.   61 y.o. female presents with the above complaint.  Patient presents with complaint of right lateral third digit ingrown painful to touch is progressive gotten worse she went to nail salon.  Patient states that hurts with ambulation hurts with pressure she wanted get it evaluated.  She denies any other acute complaints nail is causing her discomfort she would like to have it removed.  She has secondary complaint of flatfoot deformity.  She has orthotics that were made long time ago she would like to have them switched out is causing her some issues.  She would like to have another pair made.   Review of Systems: Negative except as noted in the HPI. Denies N/V/F/Ch.  Past Medical History:  Diagnosis Date   A-fib [facility])    a. new onset 05/2015, b. CHADS2VASc => 2 (HTN and sex category); c. echo 05/2015: EF 50-55%, nl wall motion, mild to mod MR, PASP 38 mm Hg   Allergy    Arthritis    Asthma    COPD (chronic obstructive pulmonary disease) (HCC)    Diverticulitis    Hypertension    Mitral regurgitation    a. echo 05/2015: mild to mod MR   Morbid obesity (HCC)    Sleep apnea     Current Outpatient Medications:    acetaminophen (ACETAMINOPHEN 8 HOUR) 650 MG CR tablet, Take 650 mg by mouth every 8 (eight) hours as needed for pain., Disp: , Rfl:    albuterol (PROVENTIL HFA;VENTOLIN HFA) 108 (90 Base) MCG/ACT inhaler, Inhale 2 puffs into the lungs every 6 (six) hours as needed for wheezing or shortness of breath., Disp: 1 Inhaler, Rfl: 2   apixaban (ELIQUIS) 5 MG TABS tablet, Take 1 tablet (5 mg total) by mouth 2 (two) times daily., Disp: 60 tablet, Rfl: 5   atorvastatin (LIPITOR) 40 MG tablet, Take 40 mg by mouth daily., Disp: , Rfl:    Calcium Carb-Cholecalciferol (CALCIUM CARBONATE-VITAMIN D3 PO), Take 25 mg by mouth 2 (two) times daily at 10 AM and 5 PM., Disp: , Rfl:     carvedilol (COREG) 25 MG tablet, Take 25 mg by mouth 2 (two) times daily with a meal., Disp: , Rfl:    cetirizine (ZYRTEC) 10 MG tablet, Take 10 mg by mouth daily., Disp: , Rfl: 11   cyclobenzaprine (FLEXERIL) 5 MG tablet, Take 5 mg by mouth as needed., Disp: , Rfl:    diltiazem (CARDIZEM CD) 240 MG 24 hr capsule, Take 1 capsule (240 mg total) by mouth daily., Disp: 90 capsule, Rfl: 1   Dulaglutide (TRULICITY) 0.75 MG/0.5ML SOPN, 0.75 mg., Disp: , Rfl:    fluticasone (FLONASE) 50 MCG/ACT nasal spray, USE 1 SPRAY(S) IN EACH NOSTRIL ONCE DAILY FOR ALLERGIES, Disp: , Rfl:    furosemide (LASIX) 40 MG tablet, Take 40 mg by mouth 2 (two) times daily. , Disp: , Rfl:    hydrOXYzine (ATARAX) 50 MG tablet, Take 50 mg by mouth as needed., Disp: , Rfl:    lidocaine (LIDODERM) 5 %, as needed., Disp: , Rfl:    metoprolol tartrate 75 MG TABS, Take 150 mg by mouth every 12 (twelve) hours., Disp: 180 tablet, Rfl: 0   oxyCODONE-acetaminophen (PERCOCET/ROXICET) 5-325 MG tablet, Take 1 tablet by mouth every 4 (four) hours as needed for severe pain., Disp: 15 tablet, Rfl: 0   potassium chloride (KLOR-CON) 10 MEQ tablet, Take  10 mEq by mouth daily., Disp: , Rfl:    potassium chloride SA (K-DUR,KLOR-CON) 20 MEQ tablet, Take 10 mEq by mouth daily., Disp: , Rfl:    Pseudoephedrine HCl, Deter, 30 MG TABA, Take 1 capsule by mouth daily., Disp: , Rfl:    SYMBICORT 160-4.5 MCG/ACT inhaler, Inhale 160 each into the lungs 1 day or 1 dose. 2 puffs, Disp: , Rfl:    tiZANidine (ZANAFLEX) 4 MG tablet, Take 1 tablet by mouth once. Half tablet evening, Disp: , Rfl:    triamterene-hydrochlorothiazide (DYAZIDE) 37.5-25 MG capsule, Take 1 capsule by mouth daily. Half tablet, Disp: , Rfl: 2  Social History   Tobacco Use  Smoking Status Never  Smokeless Tobacco Former   Quit date: 02/06/2022    Allergies  Allergen Reactions   Allegra [Fexofenadine] Other (See Comments)    cough cough   Diltiazem Hcl Other (See Comments)     Rash, joint pain, itching   Morphine And Related Nausea Only   Other Nausea Only   Claritin [Loratadine] Palpitations   Objective:  There were no vitals filed for this visit. There is no height or weight on file to calculate BMI. Constitutional Well developed. Well nourished.  Vascular Dorsalis pedis pulses palpable bilaterally. Posterior tibial pulses palpable bilaterally. Capillary refill normal to all digits.  No cyanosis or clubbing noted. Pedal hair growth normal.  Neurologic Normal speech. Oriented to person, place, and time. Epicritic sensation to light touch grossly present bilaterally.  Dermatologic Painful ingrowing nail at lateral nail borders of the hallux nail right. No other open wounds. No skin lesions.  Orthopedic: Normal joint ROM without pain or crepitus bilaterally. No visible deformities. No bony tenderness.   Radiographs: None Assessment:   1. Ingrown nail of third toe of right foot   2. Pes planovalgus    Plan:  Patient was evaluated and treated and all questions answered.  Ingrown Nail, right -Patient elects to proceed with minor surgery to remove ingrown toenail removal today. Consent reviewed and signed by patient. -Ingrown nail excised. See procedure note. -Educated on post-procedure care including soaking. Written instructions provided and reviewed. -Patient to follow up in 2 weeks for nail check.  Pes planovalgus -I explained to patient the etiology of pes planovalgus and relationship with Planter fasciitis and various treatment options were discussed.  Given patient foot structure in the setting of Planter fasciitis I believe patient will benefit from custom-made orthotics to help control the hindfoot motion support the arch of the foot and take the stress away from plantar fascial.  Patient agrees with the plan like to proceed with orthotics -Patient was casted for orthotics/diabetic shoes   Procedure: Excision of Ingrown Toenail Location:  Right 1st toe lateral nail borders. Anesthesia: Lidocaine 1% plain; 1.5 mL and Marcaine 0.5% plain; 1.5 mL, digital block. Skin Prep: Betadine. Dressing: Silvadene; telfa; dry, sterile, compression dressing. Technique: Following skin prep, the toe was exsanguinated and a tourniquet was secured at the base of the toe. The affected nail border was freed, split with a nail splitter, and excised. Chemical matrixectomy was then performed with phenol and irrigated out with alcohol. The tourniquet was then removed and sterile dressing applied. Disposition: Patient tolerated procedure well. Patient to return in 2 weeks for follow-up.   No follow-ups on file.

## 2022-06-22 ENCOUNTER — Emergency Department
Admission: EM | Admit: 2022-06-22 | Discharge: 2022-06-22 | Disposition: A | Discharge status: Home / Self Care | Payer: Medicare HMO | Attending: Emergency Medicine | Admitting: Emergency Medicine

## 2022-06-22 ENCOUNTER — Other Ambulatory Visit: Payer: Self-pay

## 2022-06-22 ENCOUNTER — Emergency Department: Payer: Medicare HMO

## 2022-06-22 DIAGNOSIS — M25561 Pain in right knee: Secondary | ICD-10-CM | POA: Insufficient documentation

## 2022-06-22 MED ORDER — OXYCODONE-ACETAMINOPHEN 5-325 MG PO TABS: 1.0000 | ORAL_TABLET | ORAL | 0 refills | Status: DC | PRN

## 2022-06-22 NOTE — ED Triage Notes (Signed)
Pt in via EMS from home with c/o leg pain. Pt reports was kneeling down to pray and now has pain to her right knee. HR 92, 100% RA, 139/92

## 2022-06-22 NOTE — ED Triage Notes (Signed)
Pt reports bent down to pray this am and her knee started to hurt so she went to get up and her knee gave out. Pt reports pain to her right knee constantly now. Pt reports hx of osteoarthritis to her right knee as well.

## 2022-06-22 NOTE — ED Provider Triage Note (Signed)
Emergency Medicine Provider Triage Evaluation Note  Molly Rivera , a 61 y.o. female  was evaluated in triage.  Pt complains of right knee pain.  Patient bent down to pray felt a large crack in her knee has had pain ever since.  This happened this morning.  Does have difficulty walking.  Patient does have a walker at home..  Review of Systems  Positive:  Negative:   Physical Exam  BP (!) 141/104 (BP Location: Left Arm)   Pulse (!) 56   Temp 98 F (36.7 C) (Oral)   Resp 20   Ht 5\' 4"  (1.626 m)   Wt (!) 152.9 kg   SpO2 100%   BMI 57.85 kg/m  Gen:   Awake, no distress   Resp:  Normal effort  MSK:   Right knee tender to palpation Other:    Medical Decision Making  Medically screening exam initiated at 8:00 AM.  Appropriate orders placed.  Molly Rivera was informed that the remainder of the evaluation will be completed by another provider, this initial triage assessment does not replace that evaluation, and the importance of remaining in the ED until their evaluation is complete.  X-ray of the right knee   Molly Blalock, PA-C 06/22/22 0801

## 2022-07-01 NOTE — ED Provider Notes (Signed)
   [facility] Provider Note    Event Date/Time   First MD Initiated Contact with Patient 06/22/22 [phone]     (approximate)   History   Knee Pain  Late entry from 06/22/2022 HPI  Molly Rivera is a 61 y.o. female complains of right knee pain.  Patient bent down to pray had pain in the leg.  Happened this morning, arrived by EMS      Physical Exam   Triage Vital Signs: ED Triage Vitals  Enc Vitals Group     BP 06/22/22 0757 (!) 141/104     Pulse Rate 06/22/22 0757 (!) 56     Resp 06/22/22 0757 20     Temp 06/22/22 0757 98 F (36.7 C)     Temp Source 06/22/22 0757 Oral     SpO2 06/22/22 0757 100 %     Weight 06/22/22 0756 (!) 337 lb (152.9 kg)     Height 06/22/22 0756 5\' 4"  (1.626 m)     Head Circumference --      Peak Flow --      Pain Score 06/22/22 0756 10     Pain Loc --      Pain Edu? --      Excl. in GC? --     Most recent vital signs: Vitals:   06/22/22 0757  BP: (!) 141/104  Pulse: (!) 56  Resp: 20  Temp: 98 F (36.7 C)  SpO2: 100%     General: Awake, no distress.   CV:  Good peripheral perfusion. regular rate and  rhythm Resp:  Normal effort. Abd:  No distention.   Other:  Right knee tender to palpation   ED Results / Procedures / Treatments   Labs (all labs ordered are listed, but only abnormal results are displayed) Labs Reviewed - No data to display   EKG     RADIOLOGY X-ray right knee    PROCEDURES:   Procedures   MEDICATIONS ORDERED IN ED: Medications - No data to display   IMPRESSION / MDM / ASSESSMENT AND PLAN / ED COURSE  I reviewed the triage vital signs and the nursing notes.                              Differential diagnosis includes, but is not limited to, fracture, contusion, sprain, effusion  Patient's presentation is most consistent with acute complicated illness / injury requiring diagnostic workup.   X-ray of the right knee negative for fracture, independently reviewed and  interpreted by me   Did explain findings to the patient.  She is to follow-up with orthopedics.  Return if worsening.  Discharged stable condition.     FINAL CLINICAL IMPRESSION(S) / ED DIAGNOSES   Final diagnoses:  Acute pain of right knee     Rx / DC Orders   ED Discharge Orders          Ordered    oxyCODONE-acetaminophen (PERCOCET) 5-325 MG tablet  Every 4 hours PRN        06/22/22 0856             Note:  This document was prepared using Dragon voice recognition software and may include unintentional dictation errors.    14/02/23, PA-C 07/01/22 1526    14/11/23, MD 07/02/22 [id number]

## 2022-07-23 ENCOUNTER — Ambulatory Visit (INDEPENDENT_AMBULATORY_CARE_PROVIDER_SITE_OTHER): Payer: Medicare HMO | Admitting: Podiatry

## 2022-07-23 DIAGNOSIS — Q666 Other congenital valgus deformities of feet: Secondary | ICD-10-CM

## 2022-07-23 NOTE — Progress Notes (Signed)
Orthotics were dispensed and they are functioning well.  I discussed with her the break-in period as well.

## 2022-07-29 ENCOUNTER — Ambulatory Visit: Payer: Medicare HMO | Attending: Cardiology | Admitting: Cardiology

## 2022-07-29 ENCOUNTER — Encounter: Payer: Self-pay | Admitting: Cardiology

## 2022-07-29 VITALS — BP 150/110 | HR 81 | Ht 64.0 in | Wt 327.4 lb

## 2022-07-29 DIAGNOSIS — I340 Nonrheumatic mitral (valve) insufficiency: Secondary | ICD-10-CM | POA: Diagnosis not present

## 2022-07-29 DIAGNOSIS — I4821 Permanent atrial fibrillation: Secondary | ICD-10-CM | POA: Diagnosis not present

## 2022-07-29 DIAGNOSIS — I10 Essential (primary) hypertension: Secondary | ICD-10-CM

## 2022-07-29 NOTE — Patient Instructions (Signed)
Medication Instructions:   Your physician recommends that you continue on your current medications as directed. Please refer to the Current Medication list given to you today.  *If you need a refill on your cardiac medications before your next appointment, please call your pharmacy*   Lab Work:  None Ordered  If you have labs (blood work) drawn today and your tests are completely normal, you will receive your results only by: [place] (if you have MyChart) OR A paper copy in the mail If you have any lab test that is abnormal or we need to change your treatment, we will call you to review the results.   Testing/Procedures:  None Ordered   Follow-Up: At [facility], you and your health needs are our priority.  As part of our continuing mission to provide you with exceptional heart care, we have created designated Provider Care Teams.  These Care Teams include your primary Cardiologist (physician) and Advanced Practice Providers (APPs -  Physician Assistants and Nurse Practitioners) who all work together to provide you with the care you need, when you need it.  We recommend signing up for the patient portal called "MyChart".  Sign up information is provided on this After Visit Summary.  MyChart is used to connect with patients for Virtual Visits (Telemedicine).  Patients are able to view lab/test results, encounter notes, upcoming appointments, etc.  Non-urgent messages can be sent to your provider as well.   To learn more about what you can do with MyChart, go to [email or web address].    Your next appointment:   5 - 6 months   The format for your next appointment:   In Person  Provider:   You may see Kate Sable, MD or one of the following Advanced Practice Providers on your designated Care Team:   Murray Hodgkins, NP Christell Faith, PA-C Cadence Kathlen Mody, PA-C Gerrie Nordmann, NP

## 2022-07-29 NOTE — Progress Notes (Signed)
Cardiology Office Note:    Date:  07/29/2022   ID:  [name], DOB [birth year], MRN [id number]  PCP:  Hillery Aldo, MD   [facility] Providers Cardiologist:  Debbe Odea, MD Electrophysiologist:  Lanier Prude, MD     Referring MD: Sandrea Hughs, NP   Chief Complaint  Patient presents with   Other    2 month f/u c/o elevated BP due to stress with mom not doing so well. Meds reviewed verbally with pt.    History of Present Illness:    Molly Rivera is a 62 y.o. female with a hx of hypertension, permanent atrial fibrillation s/p DC cardioversion 2016, moderate MR, obesity, OSA on CPAP who presents for follow-up.  Recently switched from lisinopril to losartan due to headaches.  Tolerating current doses of losartan.  Also compliant with Coreg, diltiazem.  Takes Eliquis as prescribed for stroke prophylaxis.  Denies any bleeding issues.  Denies palpitations.  Working on eating healthy and losing weight.   Prior notes Echocardiogram 10/2021 obtained at [facility] showed EF over 55%, moderate MR. TEE was being considered for possible rhythm control.     Past Medical History:  Diagnosis Date   A-fib [facility])    a. new onset 05/2015, b. CHADS2VASc => 2 (HTN and sex category); c. echo 05/2015: EF 50-55%, nl wall motion, mild to mod MR, PASP 38 mm Hg   Allergy    Arthritis    Asthma    COPD (chronic obstructive pulmonary disease) (HCC)    Diverticulitis    Hypertension    Mitral regurgitation    a. echo 05/2015: mild to mod MR   Morbid obesity (HCC)    Sleep apnea     Past Surgical History:  Procedure Laterality Date   ABDOMINAL HYSTERECTOMY     CESAREAN SECTION     ELECTROPHYSIOLOGIC STUDY N/A 06/14/2015   Procedure: CARDIOVERSION;  Surgeon: Vesta Mixer, MD;  Location: [facility] ORS;  Service: Cardiovascular;  Laterality: N/A;   HEEL SPUR EXCISION Left    knee surgery     TEE WITHOUT CARDIOVERSION N/A 06/14/2015   Procedure: TRANSESOPHAGEAL  ECHOCARDIOGRAM (TEE);  Surgeon: Vesta Mixer, MD;  Location: [facility] ORS;  Service: Cardiovascular;  Laterality: N/A;    Current Medications: Current Meds  Medication Sig   acetaminophen (ACETAMINOPHEN 8 HOUR) 650 MG CR tablet Take 650 mg by mouth every 8 (eight) hours as needed for pain.   apixaban (ELIQUIS) 5 MG TABS tablet Take 1 tablet (5 mg total) by mouth 2 (two) times daily.   atorvastatin (LIPITOR) 40 MG tablet Take 40 mg by mouth daily.   benzonatate (TESSALON) 100 MG capsule Take 100 mg by mouth 3 (three) times daily as needed.   carvedilol (COREG) 25 MG tablet Take 25 mg by mouth 2 (two) times daily with a meal.   diltiazem (CARDIZEM CD) 240 MG 24 hr capsule Take 1 capsule (240 mg total) by mouth daily.   Dulaglutide (TRULICITY) 0.75 MG/0.5ML SOPN 0.75 mg once a week.   fluticasone (FLONASE) 50 MCG/ACT nasal spray USE 1 SPRAY(S) IN EACH NOSTRIL ONCE DAILY FOR ALLERGIES   furosemide (LASIX) 40 MG tablet Take 40 mg by mouth 2 (two) times daily.    hydrOXYzine (ATARAX) 50 MG tablet Take 50 mg by mouth as needed.   ipratropium-albuterol (DUONEB) 0.5-2.5 (3) MG/3ML SOLN Take 3 mLs by nebulization as needed.   lidocaine (LIDODERM) 5 % as needed.   loratadine (CLARITIN) 10 MG tablet Take 10 mg by  mouth daily.   losartan (COZAAR) 25 MG tablet Take 25 mg by mouth daily.   omeprazole (PRILOSEC) 40 MG capsule Take 40 mg by mouth daily.   potassium chloride (KLOR-CON) 10 MEQ tablet Take 10 mEq by mouth 2 (two) times daily.   sodium chloride (OCEAN) 0.65 % SOLN nasal spray Place 1 spray into both nostrils as needed for congestion.   SYMBICORT 160-4.5 MCG/ACT inhaler Inhale 160 each into the lungs 1 day or 1 dose. 2 puffs   tiZANidine (ZANAFLEX) 4 MG tablet Take 2 mg by mouth once.     Allergies:   Allegra [fexofenadine], Diltiazem hcl, Morphine and related, Other, and Claritin [loratadine]   Social History   Socioeconomic History   Marital status: Single    Spouse name: Not on file    Number of children: Not on file   Years of education: Not on file   Highest education level: Not on file  Occupational History   Not on file  Tobacco Use   Smoking status: Never   Smokeless tobacco: Former    Quit date: 02/06/2022  Substance and Sexual Activity   Alcohol use: Yes    Alcohol/week: 0.0 standard drinks of alcohol    Comment: occasional beer   Drug use: No   Sexual activity: Yes    Birth control/protection: Surgical  Other Topics Concern   Not on file  Social History Narrative   ** Merged History Encounter **       Social Determinants of Health   Financial Resource Strain: Not on file  Food Insecurity: Not on file  Transportation Needs: Not on file  Physical Activity: Not on file  Stress: Not on file  Social Connections: Not on file     Family History: The patient's family history includes Diabetes in her mother; Healthy in her son; Hypertension in her mother; Schizophrenia in her mother. She was adopted.  ROS:   Please see the history of present illness.     All other systems reviewed and are negative.  EKGs/Labs/Other Studies Reviewed:    The following studies were reviewed today:   EKG:  EKG is  ordered today.  The ekg ordered today demonstrates atrial fibrillation, heart rate 81  Recent Labs: No results found for requested labs within last 365 days.  Recent Lipid Panel No results found for: "CHOL", "TRIG", "HDL", "CHOLHDL", "VLDL", "LDLCALC", "LDLDIRECT"   Risk Assessment/Calculations:          Physical Exam:    VS:  BP (!) 150/110 (BP Location: Left Arm, Patient Position: Sitting, Cuff Size: Large) Comment: after ekg  Pulse 81   Ht 5\' 4"  (1.626 m)   Wt (!) 327 lb 6 oz (148.5 kg)   SpO2 98%   BMI 56.19 kg/m     Wt Readings from Last 3 Encounters:  07/29/22 (!) 327 lb 6 oz (148.5 kg)  06/22/22 (!) 337 lb (152.9 kg)  05/27/22 (!) 337 lb (152.9 kg)     GEN:  Well nourished, well developed in no acute distress HEENT: Normal NECK:  No JVD; No carotid bruits CARDIAC: Irregular irregular, no murmurs RESPIRATORY:  Clear to auscultation without rales, wheezing or rhonchi  ABDOMEN: Soft, non-tender, non-distended MUSCULOSKELETAL: Trace edema; No deformity  SKIN: Warm and dry NEUROLOGIC:  Alert and oriented x 3 PSYCHIATRIC:  Normal affect   ASSESSMENT:    1. Permanent atrial fibrillation ([place])   2. Mitral valve insufficiency, unspecified etiology   3. Primary hypertension   4. Morbid obesity (  HCC)    PLAN:    In order of problems listed above:  Persistent atrial fibrillation, heart rate controlled, continue Coreg 25 twice daily, Cardizem CD 240mg , Eliquis 5 mg twice daily.  Patient is asymptomatic.  Patient is not candidate for rhythm control due to morbid obesity, OSA.   Moderate MR, echo 04/2022 shows mild to moderate MR, no significant change from prior.  Plan serial monitoring. Hypertension, BP elevated, recently started on losartan, recommend increasing dose of losartan.  Patient will like to follow-up with PCP prior to increasing losartan.  Continue Cardizem to 240 mg daily, Coreg 25 mg twice daily Morbid obesity, low-calorie diet, weight loss advised.     Follow-up in 6 months  Medication Adjustments/Labs and Tests Ordered: Current medicines are reviewed at length with the patient today.  Concerns regarding medicines are outlined above.  Orders Placed This Encounter  Procedures   EKG 12-Lead   No orders of the defined types were placed in this encounter.   Patient Instructions  Medication Instructions:   Your physician recommends that you continue on your current medications as directed. Please refer to the Current Medication list given to you today.  *If you need a refill on your cardiac medications before your next appointment, please call your pharmacy*   Lab Work:  None Ordered  If you have labs (blood work) drawn today and your tests are completely normal, you will receive your results  only by: MyChart Message (if you have MyChart) OR A paper copy in the mail If you have any lab test that is abnormal or we need to change your treatment, we will call you to review the results.   Testing/Procedures:  None Ordered   Follow-Up: At [facility], you and your health needs are our priority.  As part of our continuing mission to provide you with exceptional heart care, we have created designated Provider Care Teams.  These Care Teams include your primary Cardiologist (physician) and Advanced Practice Providers (APPs -  Physician Assistants and Nurse Practitioners) who all work together to provide you with the care you need, when you need it.  We recommend signing up for the patient portal called "MyChart".  Sign up information is provided on this After Visit Summary.  MyChart is used to connect with patients for Virtual Visits (Telemedicine).  Patients are able to view lab/test results, encounter notes, upcoming appointments, etc.  Non-urgent messages can be sent to your provider as well.   To learn more about what you can do with MyChart, go to [facility].    Your next appointment:   5 - 6 months   The format for your next appointment:   In Person  Provider:   You may see [email or web address], MD or one of the following Advanced Practice Providers on your designated Care Team:   Debbe Odea, NP Nicolasa Ducking, PA-C Cadence Eula Listen, PA-C Fransico Michael, NP      Signed, Charlsie Quest, MD  07/29/2022 10:58 AM    [facility]

## 2022-07-30 ENCOUNTER — Telehealth: Payer: Self-pay | Admitting: Cardiology

## 2022-07-30 NOTE — Telephone Encounter (Signed)
Patient states she believes she left her glasses at her appt yesterday. They have a blue frame with black, brown, and white on them. Attempted calling front desk several times, but was unable to get an answer. Please advise.

## 2022-07-30 NOTE — Telephone Encounter (Signed)
Returned call to pt and informed unfortunately no glasses were seen or returned to front desk.

## 2022-09-12 ENCOUNTER — Encounter: Payer: Self-pay | Admitting: Podiatry

## 2022-09-12 ENCOUNTER — Ambulatory Visit: Payer: Medicare HMO | Admitting: Podiatry

## 2022-09-12 VITALS — BP 170/100 | HR 69

## 2022-09-12 DIAGNOSIS — L600 Ingrowing nail: Secondary | ICD-10-CM | POA: Diagnosis not present

## 2022-09-12 NOTE — Progress Notes (Addendum)
This patient presents to the office for evaluation of her third toe which Dr. Posey Pronto performed nail surgery.  She says her nail is doing well with no pain or discomfort. Patient is diabetic. She presents for evaluation and treatment.  Vascular  Dorsalis pedis and posterior tibial pulses are palpable  B/L.  Capillary return  WNL.  Temperature gradient is  WNL.  Skin turgor  WNL  Sensorium  Senn Weinstein monofilament wire  WNL. Normal tactile sensation.  Nail Exam  Patient has normal nails with no evidence of bacterial or fungal infection. Healing third toenail with callus at the surgical site.  Orthopedic  Exam  Muscle tone and muscle strength  WNL.  No limitations of motion feet  B/L.  No crepitus or joint effusion noted.  Foot type is unremarkable and digits show no abnormalities.  HAV  B/L.  Skin  No open lesions.  Normal skin texture and turgor.   Ingrown Toenail right foot.  ROV.  Debride callus tissue at surgical site.   RTC prn   Gardiner Barefoot DPM

## 2022-12-06 ENCOUNTER — Telehealth: Payer: Self-pay | Admitting: Cardiology

## 2022-12-06 MED ORDER — DILTIAZEM HCL ER COATED BEADS 240 MG PO CP24: 240.0000 mg | ORAL_CAPSULE | Freq: Every day | ORAL | 0 refills | Status: DC

## 2022-12-06 NOTE — Telephone Encounter (Signed)
Pt c/o medication issue:  1. Name of Medication:   diltiazem (CARDIZEM CD) 240 MG 24 hr capsule    2. How are you currently taking this medication (dosage and times per day)?   Take 1 capsule (240 mg total) by mouth daily.    3. Are you having a reaction (difficulty breathing--STAT)? No  4. What is your medication issue? Pt states she has questions regarding medication.    *STAT* If patient is at the pharmacy, call can be transferred to refill team.   1. Which medications need to be refilled? (please list name of each medication and dose if known) diltiazem (CARDIZEM CD) 240 MG 24 hr capsule   2. Which pharmacy/location (including street and city if local pharmacy) is medication to be sent to? [facility] - [place], [place] - [address]   3. Do they need a 30 day or 90 day supply? 90 day   Pt needs 10 day supply sent to [id number] - [place] ([place]), [place] - [address] being that she is out of medication

## 2022-12-06 NOTE — Telephone Encounter (Signed)
Requested Prescriptions   Signed Prescriptions Disp Refills   diltiazem (CARDIZEM CD) 240 MG 24 hr capsule 90 capsule 0    Sig: Take 1 capsule (240 mg total) by mouth daily.    Authorizing Provider: Debbe Odea    Ordering User: Kendrick Fries

## 2022-12-06 NOTE — Telephone Encounter (Signed)
Requested Prescriptions   Signed Prescriptions Disp Refills   diltiazem (CARDIZEM CD) 240 MG 24 hr capsule 10 capsule 0    Sig: Take 1 capsule (240 mg total) by mouth daily.    Authorizing Provider: Debbe Odea    Ordering User: Kendrick Fries

## 2022-12-30 ENCOUNTER — Encounter: Payer: Self-pay | Admitting: Cardiology

## 2022-12-30 ENCOUNTER — Ambulatory Visit: Payer: Medicare HMO | Attending: Cardiology | Admitting: Cardiology

## 2022-12-30 VITALS — BP 169/98 | HR 81 | Ht 64.0 in | Wt 331.0 lb

## 2022-12-30 DIAGNOSIS — I340 Nonrheumatic mitral (valve) insufficiency: Secondary | ICD-10-CM

## 2022-12-30 DIAGNOSIS — I4821 Permanent atrial fibrillation: Secondary | ICD-10-CM | POA: Diagnosis not present

## 2022-12-30 DIAGNOSIS — I10 Essential (primary) hypertension: Secondary | ICD-10-CM

## 2022-12-30 NOTE — Patient Instructions (Signed)
Medication Instructions:   Your physician recommends that you continue on your current medications as directed. Please refer to the Current Medication list given to you today.  *If you need a refill on your cardiac medications before your next appointment, please call your pharmacy*   Lab Work:  None Ordered  If you have labs (blood work) drawn today and your tests are completely normal, you will receive your results only by: MyChart Message (if you have MyChart) OR A paper copy in the mail If you have any lab test that is abnormal or we need to change your treatment, we will call you to review the results.   Testing/Procedures:  Your physician has requested that you have an echocardiogram in 1 year. Echocardiography is a painless test that uses sound waves to create images of your heart. It provides your doctor with information about the size and shape of your heart and how well your heart's Schmeling and valves are working. This procedure takes approximately one hour. There are no restrictions for this procedure. Please do NOT wear cologne, perfume, aftershave, or lotions (deodorant is allowed). Please arrive 15 minutes prior to your appointment time.    Follow-Up: At [facility], you and your health needs are our priority.  As part of our continuing mission to provide you with exceptional heart care, we have created designated Provider Care Teams.  These Care Teams include your primary Cardiologist (physician) and Advanced Practice Providers (APPs -  Physician Assistants and Nurse Practitioners) who all work together to provide you with the care you need, when you need it.  We recommend signing up for the patient portal called "MyChart".  Sign up information is provided on this After Visit Summary.  MyChart is used to connect with patients for Virtual Visits (Telemedicine).  Patients are able to view lab/test results, encounter notes, upcoming appointments, etc.  Non-urgent  messages can be sent to your provider as well.   To learn more about what you can do with MyChart, go to [email or web address].    Your next appointment:    After echocardiogram   Provider:   You may see Brian Agbor-Etang, MD or one of the following Advanced Practice Providers on your designated Care Team:   Christopher Berge, NP Ryan Dunn, PA-C Cadence Furth, PA-C Sheri Hammock, NP  

## 2022-12-30 NOTE — Progress Notes (Signed)
Cardiology Office Note:    Date:  12/30/2022   ID:  Molly Rivera, DOB Dec 14, 1960, MRN [id number]  PCP:  Hillery Aldo, MD   [facility] Providers Cardiologist:  Debbe Odea, MD Electrophysiologist:  Lanier Prude, MD     Referring MD: Hillery Aldo, MD   Chief Complaint  Patient presents with   Follow-up    Patient denies new or acute cardiac problems/concerns today.  BP is elevated in office today.    History of Present Illness:    Molly Rivera is a 62 y.o. female with a hx of hypertension, permanent atrial fibrillation s/p DC cardioversion 2016, moderate MR, obesity, OSA on CPAP who presents for follow-up.  Overall doing okay, denies palpitations, chest pain.  Compliant with medications as prescribed, no bleeding issues with taking Eliquis.  Blood pressures at home range from low 100s to 140s systolic.  BP elevated at previous visit, up titration of losartan recommended, patient wanted to follow-up with PCP.  Denies new concerns at this time.   Prior notes Echocardiogram 10/2021 obtained at [facility] showed EF over 55%, moderate MR. TEE was being considered for possible rhythm control.     Past Medical History:  Diagnosis Date   A-fib [facility])    a. new onset 05/2015, b. CHADS2VASc => 2 (HTN and sex category); c. echo 05/2015: EF 50-55%, nl wall motion, mild to mod MR, PASP 38 mm Hg   Allergy    Arthritis    Asthma    COPD (chronic obstructive pulmonary disease) (HCC)    Diverticulitis    Hypertension    Mitral regurgitation    a. echo 05/2015: mild to mod MR   Morbid obesity (HCC)    Sleep apnea     Past Surgical History:  Procedure Laterality Date   ABDOMINAL HYSTERECTOMY     CESAREAN SECTION     ELECTROPHYSIOLOGIC STUDY N/A 06/14/2015   Procedure: CARDIOVERSION;  Surgeon: Vesta Mixer, MD;  Location: [facility] ORS;  Service: Cardiovascular;  Laterality: N/A;   HEEL SPUR EXCISION Left    knee surgery     TEE WITHOUT CARDIOVERSION N/A  06/14/2015   Procedure: TRANSESOPHAGEAL ECHOCARDIOGRAM (TEE);  Surgeon: Vesta Mixer, MD;  Location: [facility] ORS;  Service: Cardiovascular;  Laterality: N/A;    Current Medications: Current Meds  Medication Sig   acetaminophen (ACETAMINOPHEN 8 HOUR) 650 MG CR tablet Take 650 mg by mouth every 8 (eight) hours as needed for pain.   apixaban (ELIQUIS) 5 MG TABS tablet Take 1 tablet (5 mg total) by mouth 2 (two) times daily.   atorvastatin (LIPITOR) 40 MG tablet Take 40 mg by mouth daily.   azelastine (OPTIVAR) 0.05 % ophthalmic solution Apply 1 drop to eye 2 (two) times daily.   benzonatate (TESSALON) 100 MG capsule Take 100 mg by mouth 3 (three) times daily as needed.   carvedilol (COREG) 25 MG tablet Take 25 mg by mouth 2 (two) times daily with a meal.   cetirizine (ZYRTEC) 10 MG tablet Take 10 mg by mouth daily.   diltiazem (CARDIZEM CD) 240 MG 24 hr capsule Take 1 capsule (240 mg total) by mouth daily.   Dulaglutide (TRULICITY) 0.75 MG/0.5ML SOPN 0.75 mg once a week.   EPINEPHrine 0.3 mg/0.3 mL IJ SOAJ injection Inject 0.3 mg into the muscle as needed.   fluticasone (FLONASE) 50 MCG/ACT nasal spray USE 1 SPRAY(S) IN EACH NOSTRIL ONCE DAILY FOR ALLERGIES   furosemide (LASIX) 40 MG tablet Take 40 mg by mouth 2 (  two) times daily.    hydrOXYzine (ATARAX) 50 MG tablet Take 50 mg by mouth as needed.   ipratropium-albuterol (DUONEB) 0.5-2.5 (3) MG/3ML SOLN Take 3 mLs by nebulization as needed.   lidocaine (LIDODERM) 5 % as needed.   losartan (COZAAR) 25 MG tablet Take 25 mg by mouth daily.   montelukast (SINGULAIR) 10 MG tablet Take 10 mg by mouth daily.   omeprazole (PRILOSEC) 40 MG capsule Take 40 mg by mouth daily.   potassium chloride (KLOR-CON) 10 MEQ tablet Take 10 mEq by mouth 2 (two) times daily.   sodium chloride (OCEAN) 0.65 % SOLN nasal spray Place 1 spray into both nostrils as needed for congestion.   SYMBICORT 160-4.5 MCG/ACT inhaler Inhale 160 each into the lungs 1 day or 1 dose. 2  puffs     Allergies:   Allegra [fexofenadine], Diltiazem hcl, Metformin, Morphine and codeine, Other, and Claritin [loratadine]   Social History   Socioeconomic History   Marital status: Single    Spouse name: Not on file   Number of children: Not on file   Years of education: Not on file   Highest education level: Not on file  Occupational History   Not on file  Tobacco Use   Smoking status: Never   Smokeless tobacco: Former    Quit date: 02/06/2022  Substance and Sexual Activity   Alcohol use: Not Currently    Comment: occasional beer   Drug use: No   Sexual activity: Yes    Birth control/protection: Surgical  Other Topics Concern   Not on file  Social History Narrative   ** Merged History Encounter **       Social Determinants of Health   Financial Resource Strain: Not on file  Food Insecurity: Not on file  Transportation Needs: Not on file  Physical Activity: Not on file  Stress: Not on file  Social Connections: Not on file     Family History: The patient's family history includes Diabetes in her mother; Healthy in her son; Hypertension in her mother; Schizophrenia in her mother. She was adopted.  ROS:   Please see the history of present illness.     All other systems reviewed and are negative.  EKGs/Labs/Other Studies Reviewed:    The following studies were reviewed today:   EKG:  EKG is  ordered today.  The ekg ordered today demonstrates atrial fibrillation, heart rate 81  Recent Labs: No results found for requested labs within last 365 days.  Recent Lipid Panel No results found for: "CHOL", "TRIG", "HDL", "CHOLHDL", "VLDL", "LDLCALC", "LDLDIRECT"   Risk Assessment/Calculations:          Physical Exam:    VS:  BP (!) 169/98 (BP Location: Left Arm, Patient Position: Sitting, Cuff Size: Normal)   Pulse 81   Ht 5\' 4"  (1.626 m)   Wt (!) 331 lb (150.1 kg)   SpO2 98%   BMI 56.82 kg/m     Wt Readings from Last 3 Encounters:  12/30/22 (!) 331  lb (150.1 kg)  07/29/22 (!) 327 lb 6 oz (148.5 kg)  06/22/22 (!) 337 lb (152.9 kg)     GEN:  Well nourished, well developed in no acute distress HEENT: Normal NECK: No JVD; No carotid bruits CARDIAC: Irregular irregular, no murmurs RESPIRATORY:  Clear to auscultation without rales, wheezing or rhonchi  ABDOMEN: Soft, non-tender, non-distended MUSCULOSKELETAL: Trace edema; No deformity  SKIN: Warm and dry NEUROLOGIC:  Alert and oriented x 3 PSYCHIATRIC:  Normal affect  ASSESSMENT:    1. Permanent atrial fibrillation (HCC)   2. Mitral valve insufficiency, unspecified etiology   3. Primary hypertension     PLAN:    In order of problems listed above:  Permanent A-fib, heart rate control, asymptomatic. continue Coreg 25 twice daily, Cardizem CD 240mg , Eliquis 5 mg twice daily.  Not a candidate for rhythm control strategy. Moderate MR, echo 04/2022 shows mild to moderate MR. Repeat echo in about 1 year. Hypertension, BP elevated.  Recommend titrating losartan.  Declined titrating meds today.  States wanting to follow-up with PCP for BP med adjustments. Continue Cardizem to 240 mg daily, Coreg 25 mg twice daily, losartan 25 mg daily.     Follow-up in 12 months  Medication Adjustments/Labs and Tests Ordered: Current medicines are reviewed at length with the patient today.  Concerns regarding medicines are outlined above.  Orders Placed This Encounter  Procedures   EKG 12-Lead   ECHOCARDIOGRAM COMPLETE   No orders of the defined types were placed in this encounter.   Patient Instructions  Medication Instructions:   Your physician recommends that you continue on your current medications as directed. Please refer to the Current Medication list given to you today.  *If you need a refill on your cardiac medications before your next appointment, please call your pharmacy*   Lab Work:  None Ordered  If you have labs (blood work) drawn today and your tests are completely  normal, you will receive your results only by: MyChart Message (if you have MyChart) OR A paper copy in the mail If you have any lab test that is abnormal or we need to change your treatment, we will call you to review the results.   Testing/Procedures:  Your physician has requested that you have an echocardiogram in 1 year. Echocardiography is a painless test that uses sound waves to create images of your heart. It provides your doctor with information about the size and shape of your heart and how well your heart's Tomassetti and valves are working. This procedure takes approximately one hour. There are no restrictions for this procedure. Please do NOT wear cologne, perfume, aftershave, or lotions (deodorant is allowed). Please arrive 15 minutes prior to your appointment time.    Follow-Up: At [facility], you and your health needs are our priority.  As part of our continuing mission to provide you with exceptional heart care, we have created designated Provider Care Teams.  These Care Teams include your primary Cardiologist (physician) and Advanced Practice Providers (APPs -  Physician Assistants and Nurse Practitioners) who all work together to provide you with the care you need, when you need it.  We recommend signing up for the patient portal called "MyChart".  Sign up information is provided on this After Visit Summary.  MyChart is used to connect with patients for Virtual Visits (Telemedicine).  Patients are able to view lab/test results, encounter notes, upcoming appointments, etc.  Non-urgent messages can be sent to your provider as well.   To learn more about what you can do with MyChart, go to [email or web address].    Your next appointment:    After echocardiogram  Provider:   You may see Debbe Odea, MD or one of the following Advanced Practice Providers on your designated Care Team:   Nicolasa Ducking, NP Eula Listen, PA-C Cadence Fransico Michael, PA-C Charlsie Quest,  NP    Signed, Debbe Odea, MD  12/30/2022 9:46 AM    [facility]

## 2023-01-17 ENCOUNTER — Telehealth: Payer: Self-pay | Admitting: Cardiology

## 2023-01-17 NOTE — Telephone Encounter (Signed)
Returned the call to the patient. She stated that for the past three days she has been having a discomfort feeling in her chest. It feels like when you "squeeze a lemon". The feeling only happens 1-3 times a days and last a few seconds. The pain does not radiate and occurs when she is sitting down. She feels like it is possible that it indigestion but it not certain. She stated that she also has anxiety.  Blood pressure yesterday was 140/70 and heart rate was 58  The patient has requested her echo be moved up sooner. Message has been sent to scheduling.   ED precautions have been given.

## 2023-01-17 NOTE — Telephone Encounter (Signed)
Pt c/o of Chest Pain: STAT if CP now or developed within 24 hours  1. Are you having CP right now? No  2. Are you experiencing any other symptoms (ex. SOB, nausea, vomiting, sweating)? No  3. How long have you been experiencing CP? 3 days   4. Is your CP continuous or coming and going? Coming and going   5. Have you taken Nitroglycerin? No   Pt states chest feels like a lemon being squeezed. Pt states she also gets a sharp pain every so often and is not constant.

## 2023-01-20 ENCOUNTER — Ambulatory Visit: Payer: Medicare HMO | Attending: Cardiology

## 2023-01-20 ENCOUNTER — Other Ambulatory Visit: Payer: Self-pay

## 2023-01-20 DIAGNOSIS — I4821 Permanent atrial fibrillation: Secondary | ICD-10-CM | POA: Diagnosis not present

## 2023-01-20 LAB — ECHOCARDIOGRAM COMPLETE
AR max vel: 2.04 cm2
AV Area VTI: 2.41 cm2
AV Area mean vel: 1.94 cm2
AV Mean grad: 2.8 mmHg
AV Peak grad: 5.3 mmHg
Ao pk vel: 1.15 m/s
Est EF: 55
S' Lateral: 3.1 cm

## 2023-01-20 MED ORDER — NITROGLYCERIN 0.4 MG SL SUBL: 0.4000 mg | SUBLINGUAL_TABLET | SUBLINGUAL | 3 refills | Status: DC | PRN

## 2023-01-20 MED ORDER — NITROGLYCERIN 0.4 MG SL SUBL: 0.4000 mg | SUBLINGUAL_TABLET | SUBLINGUAL | 3 refills | Status: AC | PRN

## 2023-01-20 MED ORDER — PERFLUTREN LIPID MICROSPHERE
1.0000 mL | INTRAVENOUS | Status: AC | PRN
Start: 2023-01-20 — End: 2023-01-20
  Administered 2023-01-20: 2 mL via INTRAVENOUS

## 2023-01-20 NOTE — Telephone Encounter (Signed)
RX sent to requested pharmacy.   Thanks !

## 2023-01-20 NOTE — Telephone Encounter (Signed)
Spoke with patient and reviewed results and recommendations. Confirmed appointment for today and offered to schedule follow up appointment. She states she will schedule follow up appointment when she checks out after her echocardiogram. Will notify check out staff. No further questions at this time.     All Conversations: Chest Pain (Newest Message First) January 18, 2023 Debbe Odea, MD  to Sandi Mariscal, RN  Margrett Rud, CMA  [facility] Scheduling    01/18/23 11:34 AM Obtain echo as scheduled.  Prescribe sublingual nitro to take as needed for chest pain.  Keep follow-up appointment after echo.

## 2023-01-20 NOTE — Telephone Encounter (Signed)
Patient requesting that nitroglycerin prescription be sent to [facility] on [address] and not [address] [address]

## 2023-01-28 ENCOUNTER — Other Ambulatory Visit: Payer: Medicare HMO

## 2023-01-31 ENCOUNTER — Ambulatory Visit: Payer: Medicare HMO | Admitting: Cardiology

## 2023-02-05 ENCOUNTER — Telehealth: Payer: Self-pay | Admitting: Cardiology

## 2023-02-05 NOTE — Telephone Encounter (Signed)
Called and spoke with patient. Informed her per last office visit note that she will need to follow up in 1 year. Patient verbalizes understanding.

## 2023-02-05 NOTE — Telephone Encounter (Signed)
Patient is requesting call back to discuss if she needs a f/u appt currently. States she will not schedule if not needed. Please advise.

## 2023-03-28 ENCOUNTER — Telehealth: Payer: Self-pay | Admitting: Cardiology

## 2023-03-28 MED ORDER — DILTIAZEM HCL ER COATED BEADS 240 MG PO CP24: 240.0000 mg | ORAL_CAPSULE | Freq: Every day | ORAL | 3 refills | Status: AC

## 2023-03-28 NOTE — Telephone Encounter (Signed)
Patient had ECHO 01/20/23 and provider appt 12/30/22 - no due until June 2025

## 2023-03-28 NOTE — Telephone Encounter (Signed)
Please contact pt for future appointment. Pt never rescheduled echo or had f/u post echo.

## 2023-03-28 NOTE — Telephone Encounter (Signed)
*  STAT* If patient is at the pharmacy, call can be transferred to refill team.   1. Which medications need to be refilled? (please list name of each medication and dose if known) diltiazem (CARDIZEM CD) 240 MG 24 hr capsule  2. Which pharmacy/location (including street and city if local pharmacy) is medication to be sent to? [facility] - [place], [place] - [address]  3. Do they need a 30 day or 90 day supply?  90 day supply

## 2023-03-28 NOTE — Telephone Encounter (Signed)
Requested Prescriptions   Signed Prescriptions Disp Refills   diltiazem (CARDIZEM CD) 240 MG 24 hr capsule 90 capsule 3    Sig: Take 1 capsule (240 mg total) by mouth daily.    Authorizing Provider: Debbe Odea    Ordering User: Kendrick Fries

## 2023-04-23 ENCOUNTER — Institutional Professional Consult (permissible substitution): Payer: Medicare HMO | Admitting: Internal Medicine

## 2023-04-30 ENCOUNTER — Encounter: Payer: Self-pay | Admitting: Nurse Practitioner

## 2023-04-30 ENCOUNTER — Ambulatory Visit: Payer: Medicare HMO | Admitting: Nurse Practitioner

## 2023-04-30 VITALS — BP 146/104 | HR 80 | Temp 98.0°F | Ht 64.0 in | Wt 329.6 lb

## 2023-04-30 DIAGNOSIS — Z6841 Body Mass Index (BMI) 40.0 and over, adult: Secondary | ICD-10-CM

## 2023-04-30 DIAGNOSIS — J4540 Moderate persistent asthma, uncomplicated: Secondary | ICD-10-CM

## 2023-04-30 DIAGNOSIS — E66813 Obesity, class 3: Secondary | ICD-10-CM | POA: Diagnosis not present

## 2023-04-30 DIAGNOSIS — G4733 Obstructive sleep apnea (adult) (pediatric): Secondary | ICD-10-CM | POA: Diagnosis not present

## 2023-04-30 NOTE — Patient Instructions (Addendum)
Continue to use CPAP every night, minimum of 4-6 hours a night.  Change equipment as directed. Wash your tubing with warm soap and water daily, hang to dry. Wash humidifier portion weekly. Use bottled, distilled water and change daily Be aware of reduced alertness and do not drive or operate heavy machinery if experiencing this or drowsiness.  Exercise encouraged, as tolerated. Healthy weight management discussed.  Notify if persistent daytime sleepiness occurs even with consistent use of PAP therapy.  Add on chin strap  Try saline nasal gel at bedtime   Follow up in 6 months with Dr. Belia Heman (new pt slot). If symptoms do not improve or worsen, please contact office for sooner follow up or seek emergency care.

## 2023-04-30 NOTE — Assessment & Plan Note (Signed)
Severe OSA on CPAP. Excellent compliance and control on download. She receives benefit from use. She is having some difficulties with nasal irritation and dry mouth. We will add on chin strap as I suspect she is mouth breathing, and have her start utilizing saline nasal gel at bedtime. Suspect that persistent daytime fatigue symptoms are multifactorial related to deconditioning, morbid obesity, underlying lung and cardiac disease. Encouraged her to work on graded exercises and healthy weight loss measures. Aware of safe driving practices. She does not need an updated sleep study.   Patient Instructions  Continue to use CPAP every night, minimum of 4-6 hours a night.  Change equipment as directed. Wash your tubing with warm soap and water daily, hang to dry. Wash humidifier portion weekly. Use bottled, distilled water and change daily Be aware of reduced alertness and do not drive or operate heavy machinery if experiencing this or drowsiness.  Exercise encouraged, as tolerated. Healthy weight management discussed.  Notify if persistent daytime sleepiness occurs even with consistent use of PAP therapy.  Add on chin strap  Try saline nasal gel at bedtime   Follow up in 6 months with Dr. Belia Heman (new pt slot). If symptoms do not improve or worsen, please contact office for sooner follow up or seek emergency care.

## 2023-04-30 NOTE — Assessment & Plan Note (Signed)
Stable. Followed by [facility].

## 2023-04-30 NOTE — Assessment & Plan Note (Signed)
BMI 56. Healthy weight loss encouraged.

## 2023-04-30 NOTE — Progress Notes (Signed)
@Patient  ID: Molly Rivera, female    DOB: Jan 08, 1961, 62 y.o.   MRN: [id number]  Chief Complaint  Patient presents with   Consult    Last Sleep Study was done 02/18/2019 at [facility]. DME: [employer]. Wears CPAP every night. Average of 7-9 hours. Reports dry mouth and nasal irritation.     Referring provider: Hillery Aldo, MD  HPI: 62 year old female, never smoker referred for sleep consult.  Past medical history significant for asthma followed by asthma/allergy, PAF on Eliquis, hypertension, obesity.  TEST/EVENTS:  02/2019 PSG: AHI 49/h, SpO2 low 85%  04/30/2023: Today - sleep consult Patient presents today for sleep consult, referred by Dr. Allena Katz.  She has a history of severe sleep apnea and is currently on CPAP therapy.  Needed to establish care for ongoing management.  Wears her CPAP nightly.  Does feel like she benefits from use.  Still has some daytime fatigue.  Has an occasional headache in the morning but most the time wakes up feeling fine.  Denies any issues with drowsy driving.  No sleep paralysis or parasomnias.  No history of narcolepsy or symptoms cataplexy. She goes to bed around 11 PM.  Usually falls asleep quickly.  Wakes a couple times at night to use the restroom.  Gets up around 9 to 10 AM.  Weight fluctuates due to her retaining fluid.  She does take Lasix for this.  Last sleep study was in 2020.  Currently on CPAP 12 cm of water with a nasal mask.  She does have some issues with dry mouth and nasal irritation when she wakes up.  She has never tried adding on a chinstrap.  She has tried other masks before but has been unable to tolerate the fullface once.  She does use Flonase nasal spray and see 1 spray as needed. She has a history of hypertension, A-fib currently rate controlled, and diabetes.  No history of stroke.  She has asthma and is on Symbicort and Singulair.  She is followed by pulmonary at [facility].  Has daily symptoms of dyspnea but is stable.  No recent steroids or  antibiotics for her breathing.  No recent hospitalizations. She is a never smoker.  Does not drink alcohol.  No excessive caffeine intake.  No sleep aids.  Lives by herself.  She is on disability.  Has 1 adult child.  Family history of seasonal allergies, asthma and heart disease.  Epworth 5  03/31/2023-04/29/2023: CPAP 12 cmH2O 30/30 days; 100% >4 hr; average use 6 hr 54 min Leaks 95th 18.6 AHI 0.4  Allergies  Allergen Reactions   Allegra [Fexofenadine] Other (See Comments)    cough cough   Diltiazem Hcl Other (See Comments)    Rash, joint pain, itching   Metformin Other (See Comments)    GI distress   Morphine And Codeine Nausea Only   Other Nausea Only   Claritin [Loratadine] Palpitations    Immunization History  Administered Date(s) Administered   Influenza,inj,Quad PF,6+ Mos 04/15/2017, 05/06/2019   Influenza-Unspecified 04/15/2017   Td 08/20/1989   Tdap 01/15/2021    Past Medical History:  Diagnosis Date   A-fib [facility])    a. new onset 05/2015, b. CHADS2VASc => 2 (HTN and sex category); c. echo 05/2015: EF 50-55%, nl wall motion, mild to mod MR, PASP 38 mm Hg   Allergy    Arthritis    Asthma    COPD (chronic obstructive pulmonary disease) (HCC)    Diverticulitis    Hypertension    Mitral  regurgitation    a. echo 05/2015: mild to mod MR   Morbid obesity (HCC)    Sleep apnea     Tobacco History: Social History   Tobacco Use  Smoking Status Never  Smokeless Tobacco Former   Quit date: 02/06/2022   Counseling given: Not Answered   Outpatient Medications Prior to Visit  Medication Sig Dispense Refill   acetaminophen (ACETAMINOPHEN 8 HOUR) 650 MG CR tablet Take 650 mg by mouth every 8 (eight) hours as needed for pain.     apixaban (ELIQUIS) 5 MG TABS tablet Take 1 tablet (5 mg total) by mouth 2 (two) times daily. 60 tablet 5   atorvastatin (LIPITOR) 40 MG tablet Take 40 mg by mouth daily.     azelastine (OPTIVAR) 0.05 % ophthalmic solution Apply 1 drop to  eye 2 (two) times daily.     benzonatate (TESSALON) 100 MG capsule Take 100 mg by mouth 3 (three) times daily as needed.     carvedilol (COREG) 25 MG tablet Take 25 mg by mouth 2 (two) times daily with a meal.     cetirizine (ZYRTEC) 10 MG tablet Take 10 mg by mouth daily.     cyclobenzaprine (FLEXERIL) 5 MG tablet Take 5 mg by mouth as needed for muscle spasms.     diltiazem (CARDIZEM CD) 240 MG 24 hr capsule Take 1 capsule (240 mg total) by mouth daily. 90 capsule 3   Dulaglutide (TRULICITY) 0.75 MG/0.5ML SOPN 0.75 mg once a week.     EPINEPHrine 0.3 mg/0.3 mL IJ SOAJ injection Inject 0.3 mg into the muscle as needed.     fluticasone (FLONASE) 50 MCG/ACT nasal spray USE 1 SPRAY(S) IN EACH NOSTRIL ONCE DAILY FOR ALLERGIES     furosemide (LASIX) 40 MG tablet Take 40 mg by mouth 2 (two) times daily.      hydrOXYzine (ATARAX) 50 MG tablet Take 50 mg by mouth as needed.     ipratropium-albuterol (DUONEB) 0.5-2.5 (3) MG/3ML SOLN Take 3 mLs by nebulization as needed.     lidocaine (LIDODERM) 5 % as needed.     loratadine (CLARITIN) 10 MG tablet Take 10 mg by mouth daily.     losartan (COZAAR) 25 MG tablet Take 25 mg by mouth daily.     montelukast (SINGULAIR) 10 MG tablet Take 10 mg by mouth daily.     nitroGLYCERIN (NITROSTAT) 0.4 MG SL tablet Place 1 tablet (0.4 mg total) under the tongue every 5 (five) minutes as needed for chest pain. 25 tablet 3   omeprazole (PRILOSEC) 40 MG capsule Take 40 mg by mouth daily.     potassium chloride (KLOR-CON) 10 MEQ tablet Take 10 mEq by mouth 2 (two) times daily.     sodium chloride (OCEAN) 0.65 % SOLN nasal spray Place 1 spray into both nostrils as needed for congestion.     SYMBICORT 160-4.5 MCG/ACT inhaler Inhale 160 each into the lungs 1 day or 1 dose. 2 puffs     tiZANidine (ZANAFLEX) 4 MG tablet Take 2 mg by mouth once. (Patient not taking: Reported on 04/30/2023)     No facility-administered medications prior to visit.     Review of Systems:    Constitutional: No night sweats, fevers, chills, or lassitude. +fatigue, weight change HEENT: No headaches, difficulty swallowing, tooth/dental problems, or sore throat, ear ache. +nasal congestion/irritation, dry mouth upon waking, sneezing, itching (baseline allergy symptoms) CV:  +occasional palpitations (A fib), swelling in lower extremities (baseline). No chest pain, orthopnea, PND, anasarca,  dizziness, syncope Resp: +baseline shortness of breath with exertion. No excess mucus or change in color of mucus. No productive or non-productive. No hemoptysis. No wheezing.  No chest wall deformity GI:  No heartburn, indigestion, abdominal pain, nausea, vomiting, diarrhea, change in bowel habits, loss of appetite, bloody stools.  GU: No dysuria, change in color of urine, urgency or frequency.  No flank pain, no hematuria  Skin: No rash, lesions, ulcerations MSK:  +chronic knee pain  Neuro: No dizziness or lightheadedness.  Psych: No depression. +anxiety. No SI/HI. Mood stable.     Physical Exam:  BP (!) 146/104 (BP Location: Left Arm, Patient Position: Sitting, Cuff Size: Large)   Pulse 80   Temp 98 F (36.7 C) (Temporal)   Ht 5\' 4"  (1.626 m)   Wt (!) 329 lb 9.6 oz (149.5 kg)   SpO2 99%   BMI 56.58 kg/m   GEN: Pleasant, interactive, well-kempt; morbidly obese; in no acute distress HEENT:  Normocephalic and atraumatic. PERRLA. Sclera white. Nasal turbinates pink, moist and patent bilaterally. No rhinorrhea present. Oropharynx pink and moist, without exudate or edema. No lesions, ulcerations, or postnasal drip. Mallampati III NECK:  Supple w/ fair ROM. No JVD present. Thyroid symmetrical with no goiter or nodules palpated. No lymphadenopathy.   CV: RRR, no m/r/g, no peripheral edema. Pulses intact, +2 bilaterally. No cyanosis, pallor or clubbing. PULMONARY:  Unlabored, regular breathing. Clear bilaterally A&P w/o wheezes/rales/rhonchi. No accessory muscle use.  GI: BS present and  normoactive. Soft, non-tender to palpation. No organomegaly or masses detected. MSK: No erythema, warmth or tenderness. Cap refil <2 sec all extrem. No deformities or joint swelling noted.  Neuro: A/Ox3. No focal deficits noted.   Skin: Warm, no lesions or rashe Psych: Normal affect and behavior. Judgement and thought content appropriate.     Lab Results:  CBC    Component Value Date/Time   WBC 9.0 04/13/2018 1257   RBC 4.17 04/13/2018 1257   HGB 12.4 04/13/2018 1257   HGB 12.6 11/30/2013 2203   HCT 36.4 04/13/2018 1257   HCT 38.9 11/30/2013 2203   PLT 198 04/13/2018 1257   PLT 269 11/30/2013 2203   MCV 87.3 04/13/2018 1257   MCV 86 11/30/2013 2203   MCH 29.8 04/13/2018 1257   MCHC 34.1 04/13/2018 1257   RDW 15.1 (H) 04/13/2018 1257   RDW 16.0 (H) 11/30/2013 2203   LYMPHSABS 1.5 04/13/2018 1257   LYMPHSABS 3.2 11/30/2013 2203   MONOABS 0.6 04/13/2018 1257   MONOABS 0.8 11/30/2013 2203   EOSABS 0.1 04/13/2018 1257   EOSABS 0.2 11/30/2013 2203   BASOSABS 0.1 04/13/2018 1257   BASOSABS 0.1 11/30/2013 2203    BMET    Component Value Date/Time   NA 141 04/04/2018 0832   NA 138 11/30/2013 2203   K 2.9 (L) 04/04/2018 0832   K 3.9 11/30/2013 2203   CL 98 04/04/2018 0832   CL 106 11/30/2013 2203   CO2 35 (H) 04/04/2018 0832   CO2 27 11/30/2013 2203   GLUCOSE 165 (H) 04/04/2018 0832   GLUCOSE 99 11/30/2013 2203   BUN 24 (H) 04/04/2018 0832   BUN 20 (H) 11/30/2013 2203   CREATININE 1.38 (H) 04/04/2018 0832   CREATININE 1.18 11/30/2013 2203   CALCIUM 9.2 04/04/2018 0832   CALCIUM 8.9 11/30/2013 2203   GFRNONAA 42 (L) 04/04/2018 0832   GFRNONAA 54 (L) 11/30/2013 2203   GFRAA 49 (L) 04/04/2018 0832   GFRAA >60 11/30/2013 2203    BNP  Component Value Date/Time   BNP 145.0 (H) 06/12/2015 1155     Imaging:  No results found.  Administration History     None           No data to display          No results found for:  "NITRICOXIDE"      Assessment & Plan:   Obstructive sleep apnea Severe OSA on CPAP. Excellent compliance and control on download. She receives benefit from use. She is having some difficulties with nasal irritation and dry mouth. We will add on chin strap as I suspect she is mouth breathing, and have her start utilizing saline nasal gel at bedtime. Suspect that persistent daytime fatigue symptoms are multifactorial related to deconditioning, morbid obesity, underlying lung and cardiac disease. Encouraged her to work on graded exercises and healthy weight loss measures. Aware of safe driving practices. She does not need an updated sleep study.   Patient Instructions  Continue to use CPAP every night, minimum of 4-6 hours a night.  Change equipment as directed. Wash your tubing with warm soap and water daily, hang to dry. Wash humidifier portion weekly. Use bottled, distilled water and change daily Be aware of reduced alertness and do not drive or operate heavy machinery if experiencing this or drowsiness.  Exercise encouraged, as tolerated. Healthy weight management discussed.  Notify if persistent daytime sleepiness occurs even with consistent use of PAP therapy.  Add on chin strap  Try saline nasal gel at bedtime   Follow up in 6 months with Dr. Belia Heman (new pt slot). If symptoms do not improve or worsen, please contact office for sooner follow up or seek emergency care.    Asthma Stable. Followed by [facility].   Class 3 severe obesity with body mass index (BMI) of 50.0 to 59.9 in adult (HCC) BMI 56. Healthy weight loss encouraged.    I spent 35 minutes of dedicated to the care of this patient on the date of this encounter to include pre-visit review of records, face-to-face time with the patient discussing conditions above, post visit ordering of testing, clinical documentation with the electronic health record, making appropriate referrals as documented, and communicating  necessary findings to members of the patients care team.  Noemi Chapel, NP 04/30/2023  Pt aware and understands NP's role.

## 2023-09-05 ENCOUNTER — Telehealth: Payer: Self-pay | Admitting: Nurse Practitioner

## 2023-09-05 NOTE — Telephone Encounter (Signed)
Patient's cpap has broken. She states she was told she could get a rx for a new one. Can this please be done?

## 2023-09-05 NOTE — Telephone Encounter (Signed)
Order has already been placed by PCP they included the latest info from Katie in the order nothing further needed

## 2024-03-03 ENCOUNTER — Ambulatory Visit: Admitting: Student

## 2024-04-01 NOTE — Progress Notes (Unsigned)
 Cardiology Clinic Note   Date: 04/02/2024 ID: Molly Rivera, DOB August 23, 1960, MRN [id number]  [facility] Providers Cardiologist:  Molly Cave, MD Electrophysiologist:  Molly ONEIDA HOLTS, MD   Chief Complaint   Molly Rivera is a 63 y.o. female who presents to the clinic today for routine   Patient Profile   Molly Rivera is followed by Dr. Cave and Dr. HOLTS for the history outlined below.      Past medical history significant for: Permanent A-fib. Onset November 2016. DCCV November 2016. Echo 01/20/2023: EF 55%.  No RWMA.  Mild LVH.  Indeterminate diastolic parameters.  Normal RV size/function.  Moderately elevated PA pressure, RVSP 54.4 mmHg, moderate LAE, mild to moderate MR. Hypertension. Hyperlipidemia. Lipid panel 05/22/2023: LDL 45, HDL 54, TG 50, total 111. OSA. CPAP 100% adherence.  In summary, patient presented to [facility] on 06/12/2015 with complaints of weakness and palpitations.  She was found to be in new onset A-fib with RVR with heart rates in the 130s.  Echo demonstrated EF 50 to 55%, no RWMA, mild to moderate MR, moderate LAE, mildly elevated PA pressure.  She underwent DCCV during her hospital admission.  She was discharged on Eliquis .  Upon discharge she was followed by Dr. Ammon at [facility] cardiology.  She changed her care to [facility] cardiology in September 2018 and was followed by Dr. Eloy with last visit April 2023.  Patient establish care with Dr. Cave on 04/15/2022.  She was in rate controlled A-fib at that time.  She was referred to EP and saw Dr. HOLTS on 04/17/2022.  It was felt she was not a candidate for invasive EP procedures due to body habitus.  She was continued on diltiazem  and carvedilol  for rate control and Eliquis  for stroke prophylaxis.  Echo October 2023 demonstrated EF 55 to 60%, normal RV size/function, mildly elevated PA pressure, severe LAE, mild to moderate MR.  Patient was last seen in the office by Dr.  Cave on 12/30/2022 for routine follow-up.  She was doing well at that time with no acute cardiac complaints.  BP was elevated at the time of her visit.  It was recommended she increase losartan but declined preferring to follow-up with PCP.  All other medications were continued.     History of Present Illness    Today, patient reports recent increased fatigue. She is adherent to CPAP but reports a piece of it recently broke and she is hoping she will qualify for a new machine. PCP manages CPAP and mentioned doing another sleep study. I encouraged patient to follow up with PCP to see if CPAP settings need to be changed. She has baseline dyspnea from asthma. She denies lower extremity edema, orthopnea or PND. No chest pain, pressure, or tightness. No palpitations.  BP is elevated today. She states PCP recently increase losartan to 100 mg daily. She feels her BP is greatly improved as it used to run in the 200s systolic. Home BP readings typically in the 140s/90-100s. She reports being under a lot of stress secondary to her mother having dementia. Her mother is still able to live alone but she has to do a lot for her.     ROS: All other systems reviewed and are otherwise negative except as noted in History of Present Illness.  EKGs/Labs Reviewed    EKG Interpretation Date/Time:  Friday April 02 2024 09:13:12 EDT Ventricular Rate:  74 PR Interval:    QRS Duration:  80 QT Interval:  394 QTC Calculation:  437 R Axis:   11  Text Interpretation: Atrial fibrillation When compared with ECG of 12/30/2022 (not in Muse) No significant change was found Confirmed by Loistine Sober [phone]) on 04/02/2024 9:16:01 AM   Labs from outside facility reviewed 03/25/2024: WBC 5.5, hemoglobin 12.2, hematocrit 41, sodium 142, potassium 3.5, BUN 12, creatinine 1.08, AST 12, ALT 13, TSH 1.3.  Risk Assessment/Calculations     CHA2DS2-VASc Score = 2   This indicates a 2.2% annual risk of stroke. The  patient's score is based upon: CHF History: 0 HTN History: 1 Diabetes History: 0 Stroke History: 0 Vascular Disease History: 0 Age Score: 0 Gender Score: 1       Physical Exam    VS:  BP (!) 150/100 (BP Location: Left Arm, Patient Position: Sitting, Cuff Size: Large)   Pulse 74   Ht 5' 4 (1.626 m)   Wt (!) 331 lb 12.8 oz (150.5 kg)   SpO2 98%   BMI 56.95 kg/m  , BMI Body mass index is 56.95 kg/m.  GEN: Well nourished, well developed, in no acute distress. Neck: No JVD or carotid bruits. Cardiac:  Irregularly irregular rhythm.  No murmur. No rubs or gallops.   Respiratory:  Respirations regular and unlabored. Clear to auscultation without rales, wheezing or rhonchi. GI: Soft, nontender, nondistended. Extremities: Radials/DP/PT 2+ and equal bilaterally. No clubbing or cyanosis. No edema.  Skin: Warm and dry, no rash. Neuro: Strength intact.  Assessment & Plan   Permanent A-fib Onset November 2016.  S/p DCCV November 2016.  Echo July 2024 demonstrated normal LV/RV function, moderately elevated PA pressure, moderate LAE, mild to moderate MR.  Denies spontaneous bleeding concerns. Patient denies palpitations or cardiac awareness of arrhythmia. EKG demonstrates afib 74 bpm.  - Continue diltiazem , carvedilol , Eliquis .  Hypertension BP today 150/100. Discussed the importance of BP control at length with patient. She feels BP control is greatly improved, as SBP used to be in the 200s. PCP increased losartan to 100 mg daily at 9/4 visit. She declines any further medication changes, as she would like to see if BP improves with more time on the medication. Patient is encouraged to send in BP readings to PCP in the next week or 2 to determine if she needs further changes. She agrees to do so.  - Continue losartan, diltiazem , carvedilol .  Hyperlipidemia LDL 45 October 2024, at goal.  - Continue atorvastatin. - Continue to follow with PCP.   OSA Patient reports recent increase in  fatigue. She is adherent with CPAP. She reports a piece of her machine recently broke. PCP had mentioned doing another sleep study but she initially declined, as she would not be eligible for a new machine. Now that the piece is broken she may be able to get a new machine. Suggested she reach out to PCP about feeling more tired, as it could be related to CPAP settings. She states she did forget to mention the fatigue to PCP and will contact her to let her know and see if her machine settings can be reviewed.  - Encouraged continued use of CPAP.   Disposition: Return in 1 year or sooner as needed.          Signed, Sober HERO. Heavenlee Maiorana, DNP, NP-C

## 2024-04-02 ENCOUNTER — Ambulatory Visit: Attending: Student | Admitting: Student

## 2024-04-02 ENCOUNTER — Encounter: Payer: Self-pay | Admitting: Student

## 2024-04-02 VITALS — BP 150/100 | HR 74 | Ht 64.0 in | Wt 331.8 lb

## 2024-04-02 DIAGNOSIS — I4821 Permanent atrial fibrillation: Secondary | ICD-10-CM | POA: Diagnosis not present

## 2024-04-02 DIAGNOSIS — E7800 Pure hypercholesterolemia, unspecified: Secondary | ICD-10-CM

## 2024-04-02 DIAGNOSIS — I10 Essential (primary) hypertension: Secondary | ICD-10-CM | POA: Diagnosis not present

## 2024-04-02 DIAGNOSIS — G4733 Obstructive sleep apnea (adult) (pediatric): Secondary | ICD-10-CM | POA: Diagnosis not present

## 2024-04-02 NOTE — Patient Instructions (Signed)
 Medication Instructions:  Your physician recommends that you continue on your current medications as directed. Please refer to the Current Medication list given to you today.   *If you need a refill on your cardiac medications before your next appointment, please call your pharmacy*  Lab Work: None ordered at this time  If you have labs (blood work) drawn today and your tests are completely normal, you will receive your results only by: MyChart Message (if you have MyChart) OR A paper copy in the mail If you have any lab test that is abnormal or we need to change your treatment, we will call you to review the results.  Testing/Procedures: None ordered at this time   Follow-Up: At [facility], you and your health needs are our priority.  As part of our continuing mission to provide you with exceptional heart care, our providers are all part of one team.  This team includes your primary Cardiologist (physician) and Advanced Practice Providers or APPs (Physician Assistants and Nurse Practitioners) who all work together to provide you with the care you need, when you need it.  Your next appointment:   1 year(s)  Provider:   Redell Cave, MD or Barnie Hila, NP    We recommend signing up for the patient portal called MyChart.  Sign up information is provided on this After Visit Summary.  MyChart is used to connect with patients for Virtual Visits (Telemedicine).  Patients are able to view lab/test results, encounter notes, upcoming appointments, etc.  Non-urgent messages can be sent to your provider as well.   To learn more about what you can do with MyChart, go to [email or web address].

## 2024-04-05 ENCOUNTER — Telehealth: Payer: Self-pay | Admitting: Cardiology

## 2024-04-05 NOTE — Telephone Encounter (Signed)
 Pt's pcp notes were faxed as requested for the patient's past appt on 04/02/24 with Barnie Hila, NP. Notes were printed and placed in Dr. Bobetta nurse box.

## 2024-05-17 ENCOUNTER — Encounter: Payer: Self-pay | Admitting: Nurse Practitioner

## 2024-05-17 ENCOUNTER — Telehealth: Payer: Self-pay | Admitting: Cardiology

## 2024-05-17 ENCOUNTER — Ambulatory Visit: Admitting: Nurse Practitioner

## 2024-05-17 VITALS — BP 172/110 | HR 97 | Temp 98.5°F | Ht 64.0 in | Wt 326.0 lb

## 2024-05-17 DIAGNOSIS — I10 Essential (primary) hypertension: Secondary | ICD-10-CM

## 2024-05-17 DIAGNOSIS — E66813 Obesity, class 3: Secondary | ICD-10-CM | POA: Diagnosis not present

## 2024-05-17 DIAGNOSIS — G4733 Obstructive sleep apnea (adult) (pediatric): Secondary | ICD-10-CM | POA: Diagnosis not present

## 2024-05-17 DIAGNOSIS — Z6841 Body Mass Index (BMI) 40.0 and over, adult: Secondary | ICD-10-CM

## 2024-05-17 NOTE — Telephone Encounter (Signed)
 Left a message for the patient to call back.

## 2024-05-17 NOTE — Progress Notes (Signed)
 @Patient  ID: Molly Rivera, female    DOB: [birth year], 63 y.o.   MRN: [id number]  Chief Complaint  Patient presents with   Obstructive Sleep Apnea    Referring provider: Tobie Domino, MD  HPI: 63 year old female, never smoker followed for sleep apnea.  Past medical history significant for asthma followed by asthma/allergy, PAF on Eliquis , hypertension, obesity.  TEST/EVENTS:  02/2019 PSG: AHI 49/h, SpO2 low 85%  04/30/2023: OV with Enda Santo NP Patient presents today for sleep consult, referred by Dr. Tobie.  She has a history of severe sleep apnea and is currently on CPAP therapy.  Needed to establish care for ongoing management.  Wears her CPAP nightly.  Does feel like she benefits from use.  Still has some daytime fatigue.  Has an occasional headache in the morning but most the time wakes up feeling fine.  Denies any issues with drowsy driving.  No sleep paralysis or parasomnias.  No history of narcolepsy or symptoms cataplexy. She goes to bed around 11 PM.  Usually falls asleep quickly.  Wakes a couple times at night to use the restroom.  Gets up around 9 to 10 AM.  Weight fluctuates due to her retaining fluid.  She does take Lasix  for this.  Last sleep study was in 2020.  Currently on CPAP 12 cm of water with a nasal mask.  She does have some issues with dry mouth and nasal irritation when she wakes up.  She has never tried adding on a chinstrap.  She has tried other masks before but has been unable to tolerate the fullface once.  She does use Flonase nasal spray and see 1 spray as needed. She has a history of hypertension, A-fib currently rate controlled, and diabetes.  No history of stroke.  She has asthma and is on Symbicort and Singulair.  She is followed by pulmonary at [facility].  Has daily symptoms of dyspnea but is stable.  No recent steroids or antibiotics for her breathing.  No recent hospitalizations. She is a never smoker.  Does not drink alcohol.  No excessive caffeine intake.  No  sleep aids.  Lives by herself.  She is on disability.  Has 1 adult child.  Family history of seasonal allergies, asthma and heart disease. Epworth 5 03/31/2023-04/29/2023: CPAP 12 cmH2O 30/30 days; 100% >4 hr; average use 6 hr 54 min Leaks 95th 18.6 AHI 0.4  05/17/2024: Today - follow up Discussed the use of AI scribe software for clinical note transcription with the patient, who gave verbal consent to proceed.  History of Present Illness Molly Rivera is a 63 year old female with obstructive sleep apnea and hypertension who presents for CPAP management.  Her CPAP therapy is going well, and she is due for a new machine. A piece broke off of it but it is still working okay. She uses a nose piece mask because a full mouth mask causes aerophagia, leading to stomach upset. The patient denies snoring or drowsy driving while using the CPAP. She experiences daily tiredness, which she attributes to allergy shots that induce drowsiness. She feels she sleeps well at night.   Her blood pressure was recorded at 180/110 mmHg during the visit. She took her blood pressure medication at 7 AM today. At home, her blood pressure readings have been around 171/98 mmHg. 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 She has not yet contacted her cardiologist about changing her medication due to being busy with  caregiving responsibilities. She denies any vision changes, headaches.   She experienced some discomfort in the center of her chest yesterday morning, which she thought might be gas. It quickly resolved. She has not have any CP since. She has nitroglycerin  available but has not had to use it.   04/14/2024-05/13/2024: CPAP 12 cmH2O 30/30 days; 93% >4 hr; average use 6 hr 43 min Leaks 95th 1 AHI 0.1    Allergies  Allergen Reactions   Allegra [Fexofenadine] Other (See Comments)    cough cough   Diltiazem  Hcl Other (See Comments)    Rash, joint pain,  itching   Metformin Other (See Comments)    GI distress   Morphine  And Codeine Nausea Only   Other Nausea Only   Claritin [Loratadine] Palpitations    Immunization History  Administered Date(s) Administered   Influenza, Seasonal, Injecte, Preservative Fre 05/12/2023   Influenza,inj,Quad PF,6+ Mos 04/15/2017, 05/06/2019, 05/22/2020, 07/31/2021, 05/16/2022, 04/30/2024   Influenza-Unspecified 05/04/2014, 04/15/2017   Moderna Covid-19 Fall Seasonal Vaccine 4yrs & older 05/16/2022   PFIZER(Purple Top)SARS-COV-2 Vaccination 10/14/2019, 11/04/2019, 08/02/2020, 01/15/2021   PNEUMOCOCCAL CONJUGATE-20 10/10/2022   Td 08/20/1989   Tdap 01/15/2021    Past Medical History:  Diagnosis Date   A-fib [facility])    a. new onset 05/2015, b. CHADS2VASc => 2 (HTN and sex category); c. echo 05/2015: EF 50-55%, nl wall motion, mild to mod MR, PASP 38 mm Hg   Allergy    Arthritis    Asthma    COPD (chronic obstructive pulmonary disease) (HCC)    Diverticulitis    Hypertension    Mitral regurgitation    a. echo 05/2015: mild to mod MR   Morbid obesity (HCC)    Sleep apnea     Tobacco History: Social History   Tobacco Use  Smoking Status Never  Smokeless Tobacco Former   Quit date: 02/06/2022   Counseling given: Not Answered   Outpatient Medications Prior to Visit  Medication Sig Dispense Refill   acetaminophen  (ACETAMINOPHEN  8 HOUR) 650 MG CR tablet Take 650 mg by mouth every 8 (eight) hours as needed for pain.     albuterol  (PROVENTIL ) (2.5 MG/3ML) 0.083% nebulizer solution Take 2.5 mg by nebulization every 4 (four) hours as needed.     Albuterol  Sulfate (VENTOLIN  HFA IN) Inhale 2 puffs into the lungs as needed.     apixaban  (ELIQUIS ) 5 MG TABS tablet Take 1 tablet (5 mg total) by mouth 2 (two) times daily. 60 tablet 5   atorvastatin (LIPITOR) 40 MG tablet Take 40 mg by mouth daily.     azelastine (OPTIVAR) 0.05 % ophthalmic solution Apply 1 drop to eye 2 (two) times daily.     carvedilol   (COREG ) 25 MG tablet Take 25 mg by mouth 2 (two) times daily with a meal.     cetirizine (ZYRTEC) 10 MG tablet Take 10 mg by mouth daily.     cyclobenzaprine (FLEXERIL) 5 MG tablet Take 5 mg by mouth as needed for muscle spasms.     diltiazem  (CARDIZEM  CD) 240 MG 24 hr capsule Take 1 capsule (240 mg total) by mouth daily. 90 capsule 3   Dulaglutide (TRULICITY) 0.75 MG/0.5ML SOPN 0.75 mg once a week.     EPINEPHrine 0.3 mg/0.3 mL IJ SOAJ injection Inject 0.3 mg into the muscle as needed.     fluticasone (FLONASE) 50 MCG/ACT nasal spray USE 1 SPRAY(S) IN EACH NOSTRIL ONCE DAILY FOR ALLERGIES     furosemide  (LASIX ) 40 MG tablet Take 40  mg by mouth 2 (two) times daily.      ipratropium-albuterol  (DUONEB) 0.5-2.5 (3) MG/3ML SOLN Take 3 mLs by nebulization as needed.     lidocaine  (LIDODERM ) 5 % as needed.     losartan (COZAAR) 25 MG tablet Take 25 mg by mouth daily. (Patient taking differently: Take 100 mg by mouth daily.)     montelukast (SINGULAIR) 10 MG tablet Take 10 mg by mouth daily.     nitroGLYCERIN  (NITROSTAT ) 0.4 MG SL tablet Place 1 tablet (0.4 mg total) under the tongue every 5 (five) minutes as needed for chest pain. 25 tablet 3   omeprazole (PRILOSEC) 40 MG capsule Take 40 mg by mouth daily.     potassium chloride  (KLOR-CON ) 10 MEQ tablet Take 10 mEq by mouth 2 (two) times daily.     sodium chloride  (OCEAN) 0.65 % SOLN nasal spray Place 1 spray into both nostrils as needed for congestion.     SYMBICORT 160-4.5 MCG/ACT inhaler Inhale 160 each into the lungs 1 day or 1 dose. 2 puffs     benzonatate (TESSALON) 100 MG capsule Take 100 mg by mouth 3 (three) times daily as needed.     hydrOXYzine (ATARAX) 50 MG tablet Take 50 mg by mouth as needed.     loratadine (CLARITIN) 10 MG tablet Take 10 mg by mouth daily. (Patient not taking: Reported on 05/17/2024)     No facility-administered medications prior to visit.     Review of Systems: as above    Physical Exam:  BP (!) 172/110    Pulse 97   Temp 98.5 F (36.9 C) (Oral)   Ht 5' 4 (1.626 m)   Wt (!) 326 lb (147.9 kg)   SpO2 99%   BMI 55.96 kg/m   GEN: Pleasant, interactive, well-kempt; morbidly obese; in no acute distress HEENT:  Normocephalic and atraumatic. PERRLA. Sclera white. Nasal turbinates pink, moist and patent bilaterally. No rhinorrhea present. Oropharynx pink and moist, without exudate or edema. No lesions, ulcerations, or postnasal drip. Mallampati III NECK:  Supple w/ fair ROM. No JVD present. Thyroid symmetrical with no goiter or nodules palpated. No lymphadenopathy.   CV: RRR, no m/r/g, no peripheral edema. Pulses intact, +2 bilaterally. No cyanosis, pallor or clubbing. PULMONARY:  Unlabored, regular breathing. Clear bilaterally A&P w/o wheezes/rales/rhonchi. No accessory muscle use.  GI: BS present and normoactive. Soft, non-tender to palpation.  MSK: No erythema, warmth or tenderness.  Neuro: A/Ox3. No focal deficits noted.   Skin: Warm, no lesions or rashe Psych: Normal affect and behavior. Judgement and thought content appropriate.     Lab Results:  CBC    Component Value Date/Time   WBC 9.0 04/13/2018 1257   RBC 4.17 04/13/2018 1257   HGB 12.4 04/13/2018 1257   HGB 12.6 11/30/2013 2203   HCT 36.4 04/13/2018 1257   HCT 38.9 11/30/2013 2203   PLT 198 04/13/2018 1257   PLT 269 11/30/2013 2203   MCV 87.3 04/13/2018 1257   MCV 86 11/30/2013 2203   MCH 29.8 04/13/2018 1257   MCHC 34.1 04/13/2018 1257   RDW 15.1 (H) 04/13/2018 1257   RDW 16.0 (H) 11/30/2013 2203   LYMPHSABS 1.5 04/13/2018 1257   LYMPHSABS 3.2 11/30/2013 2203   MONOABS 0.6 04/13/2018 1257   MONOABS 0.8 11/30/2013 2203   EOSABS 0.1 04/13/2018 1257   EOSABS 0.2 11/30/2013 2203   BASOSABS 0.1 04/13/2018 1257   BASOSABS 0.1 11/30/2013 2203    BMET    Component Value Date/Time  NA 141 04/04/2018 0832   NA 138 11/30/2013 2203   K 2.9 (L) 04/04/2018 0832   K 3.9 11/30/2013 2203   CL 98 04/04/2018 0832   CL 106  11/30/2013 2203   CO2 35 (H) 04/04/2018 0832   CO2 27 11/30/2013 2203   GLUCOSE 165 (H) 04/04/2018 0832   GLUCOSE 99 11/30/2013 2203   BUN 24 (H) 04/04/2018 0832   BUN 20 (H) 11/30/2013 2203   CREATININE 1.38 (H) 04/04/2018 0832   CREATININE 1.18 11/30/2013 2203   CALCIUM 9.2 04/04/2018 0832   CALCIUM 8.9 11/30/2013 2203   GFRNONAA 42 (L) 04/04/2018 0832   GFRNONAA 54 (L) 11/30/2013 2203   GFRAA 49 (L) 04/04/2018 0832   GFRAA >60 11/30/2013 2203    BNP    Component Value Date/Time   BNP 145.0 (H) 06/12/2015 1155     Imaging:  No results found.  Administration History     None           No data to display          No results found for: NITRICOXIDE      Assessment & Plan:   No problem-specific Assessment & Plan notes found for this encounter. Assessment and Plan Assessment & Plan Obstructive Sleep Apnea Severe OSA on CPAP. Excellent compliance and control. Receives benefit from use. Residual fatigue is multifactorial related to deconditioning, stress, life events. Aware of risks of untreated OSA. Understands proper care/use of device. Healthy weight loss encouraged. Safe driving practices reviewed. New CPAP order placed.  - Order new CPAP machine - Instruct her to contact the medical supply company if not contacted within 2-3 weeks - Schedule follow-up in 10-12 weeks to assess new CPAP machine efficacy  Chest Discomfort She experienced central chest discomfort yesterday morning, which resolved spontaneously. She suspects it was gas-related. She has nitroglycerin  available. Advised to seek emergency medical attention if chest pain recurs, especially given elevated BP.  - Advise her to seek medical attention if chest discomfort recurs and persists, especially with hypertension  Hypertension Her blood pressure is elevated at 180/110 mmHg today, with home readings around 171/98 mmHg. Recheck at 172/110. No red flag symptoms. Significant stress from  caregiving for her mother with dementia may contribute some to this elevation. She has not contacted her cardiologist about adjusting her antihypertensive medication. Advised to contact cardiologist for antihypertensive change/adjustment. Strict ED precautions reviewed.  - Continue to monitor BP at home for goal <140/90 - Instruct her to contact cardiologist regarding blood pressure management today  - Advise her to seek medical attention if experiencing headaches, vision changes, chest pain, DOE, or acute symptoms   Morbid obesity BMI 55 -Healthy weight management encouraged.       I spent 35 minutes of dedicated to the care of this patient on the date of this encounter to include pre-visit review of records, face-to-face time with the patient discussing conditions above, post visit ordering of testing, clinical documentation with the electronic health record, making appropriate referrals as documented, and communicating necessary findings to members of the patients care team.  Comer LULLA Rouleau, NP 05/17/2024  Pt aware and understands NP's role.

## 2024-05-17 NOTE — Patient Instructions (Addendum)
 Continue to use CPAP every night, minimum of 4-6 hours a night.  Change equipment as directed. Wash your tubing with warm soap and water daily, hang to dry. Wash humidifier portion weekly. Use bottled, distilled water and change daily Be aware of reduced alertness and do not drive or operate heavy machinery if experiencing this or drowsiness.  Exercise encouraged, as tolerated. Healthy weight management discussed.  Avoid or decrease alcohol consumption and medications that make you more sleepy, if possible. Notify if persistent daytime sleepiness occurs even with consistent use of PAP therapy.  Change CPAP supplies... Every month Mask cushions and/or nasal pillows CPAP machine filters Every 3 months Mask frame (not including the headgear) CPAP tubing Every 6 months Mask headgear Chin strap (if applicable) Humidifier water tub  Orders placed for a new CPAP machine 12 cmH2O  Call your heart doctor about your blood pressure Goal is less than 140/90   Follow up in 10-12 weeks with Molly Anara Cowman,NP to see how new CPAP is working. If symptoms do not improve or worsen, please contact office for sooner follow up or seek emergency care.

## 2024-05-17 NOTE — Telephone Encounter (Signed)
 Patient came by office - please call to discuss   Pt c/o BP issue: STAT if pt c/o blurred vision, one-sided weakness or slurred speech.  STAT if BP is GREATER than 180/120 TODAY.  STAT if BP is LESS than 90/60 and SYMPTOMATIC TODAY  1. What is your BP concern? Since change in medication, BP has been high  2. Have you taken any BP medication today? yes   3. What are your last 5 BP readings? Patient had BP taken twice at Pulmonary appt today 180/110 and then 174/110  4. Are you having any other symptoms (ex. Dizziness, headache, blurred vision, passed out)? Did not mention, unable to ask

## 2024-05-18 ENCOUNTER — Other Ambulatory Visit: Payer: Self-pay

## 2024-05-18 NOTE — Telephone Encounter (Signed)
 Called patient with recommendations. Patient states she already takes a ton of medications including coreg , diltiazem  and losartan. She is wanting to know if she can come off of any of these medications and only take hydralazine for the blood pressure. Please advise.

## 2024-05-18 NOTE — Telephone Encounter (Signed)
 Returned pt's call; verified pt with 2 identifiers; pt states that her blood pressure on average with the wrist cuff has been running 167/100 - 167/110 and continues to be in Afib.  Offered to schedule an appointment for her to be seen, but she states that she is taking care of her mom with dementia and she doesn't feel she needs an appointment at this time; I requested that she keep a record of her blood pressure for the next few days and if it remained high to call our office;  I feel like she needs an appointment to have her medications adjusted; will send to Dr. Darliss for further recommendations/advise.

## 2024-06-16 ENCOUNTER — Ambulatory Visit: Admitting: Cardiology

## 2024-07-19 ENCOUNTER — Other Ambulatory Visit: Payer: Self-pay

## 2024-07-19 ENCOUNTER — Emergency Department
Admission: EM | Admit: 2024-07-19 | Discharge: 2024-07-19 | Disposition: A | Discharge status: Home / Self Care | Attending: Emergency Medicine | Admitting: Emergency Medicine

## 2024-07-19 DIAGNOSIS — M5441 Lumbago with sciatica, right side: Secondary | ICD-10-CM | POA: Insufficient documentation

## 2024-07-19 DIAGNOSIS — M545 Low back pain, unspecified: Secondary | ICD-10-CM | POA: Diagnosis present

## 2024-07-19 DIAGNOSIS — M5431 Sciatica, right side: Secondary | ICD-10-CM

## 2024-07-19 DIAGNOSIS — E119 Type 2 diabetes mellitus without complications: Secondary | ICD-10-CM | POA: Diagnosis not present

## 2024-07-19 MED ORDER — NAPROXEN 500 MG PO TABS: 500.0000 mg | ORAL_TABLET | Freq: Two times a day (BID) | ORAL | 2 refills | Status: AC

## 2024-07-19 MED ORDER — KETOROLAC TROMETHAMINE 30 MG/ML IJ SOLN
30.0000 mg | Freq: Once | INTRAMUSCULAR | Status: AC
  Administered 2024-07-19: 30 mg via INTRAMUSCULAR
  Filled 2024-07-19: qty 1

## 2024-07-19 MED ORDER — METHOCARBAMOL 500 MG PO TABS: 500.0000 mg | ORAL_TABLET | Freq: Three times a day (TID) | ORAL | 1 refills | Status: AC | PRN

## 2024-07-19 NOTE — ED Triage Notes (Signed)
 Right lower back pain x 2-3 days.  Denies injury. STates has had sciatic pain in the past, but this is worse.  AAOx3. Skin warm and dry. NAD

## 2024-07-19 NOTE — ED Provider Notes (Signed)
 "  [facility] Provider Note    Event Date/Time   First MD Initiated Contact with Patient 07/19/24 [id number]     (approximate)   History   Back Pain   HPI  Molly Rivera is a 63 y.o. female with history of diabetes, atrial fibrillation who presents with complaints of right lower back pain with sciatica.  She reports she has been dealing with this since 2018.  Symptoms have flared up over the last 2 to 3 days.  She denies numbness or weakness.  No fevers.  No injury or trauma.  No incontinence     Physical Exam   Triage Vital Signs: ED Triage Vitals  Encounter Vitals Group     BP 07/19/24 0933 (!) 158/121     Girls Systolic BP Percentile --      Girls Diastolic BP Percentile --      Boys Systolic BP Percentile --      Boys Diastolic BP Percentile --      Pulse Rate 07/19/24 0933 67     Resp 07/19/24 0933 16     Temp 07/19/24 0933 98 F (36.7 C)     Temp Source 07/19/24 0933 Oral     SpO2 07/19/24 0933 96 %     Weight 07/19/24 0932 (!) 147.9 kg (326 lb 1 oz)     Height --      Head Circumference --      Peak Flow --      Pain Score 07/19/24 0931 9     Pain Loc --      Pain Education --      Exclude from Growth Chart --     Most recent vital signs: Vitals:   07/19/24 0933  BP: (!) 158/121  Pulse: 67  Resp: 16  Temp: 98 F (36.7 C)  SpO2: 96%     General: Awake, no distress.  CV:  Good peripheral perfusion.  Resp:  Normal effort.  Abd:  No distention.  Other:  Mild tenderness right lumbar paraspinal area, no rash, no vertebral tenderness to palpation, good strength in lower extremities, ambulating well without difficulty, no saddle anesthesia, warm and well-perfused   ED Results / Procedures / Treatments   Labs (all labs ordered are listed, but only abnormal results are displayed) Labs Reviewed - No data to display   EKG     RADIOLOGY     PROCEDURES:  Critical Care performed:   Procedures   MEDICATIONS ORDERED  IN ED: Medications  ketorolac  (TORADOL ) 30 MG/ML injection 30 mg (has no administration in time range)     IMPRESSION / MDM / ASSESSMENT AND PLAN / ED COURSE  I reviewed the triage vital signs and the nursing notes. Patient's presentation is most consistent with exacerbation of chronic illness.  Patient presents with what appears to be exacerbation of sciatica.  No red flag symptoms, will treat with IM Toradol , Rx for naproxen , Robaxin, outpatient follow-up with PCP.  Considered steroids however given her diabetes opted to avoid        FINAL CLINICAL IMPRESSION(S) / ED DIAGNOSES   Final diagnoses:  Sciatica of right side     Rx / DC Orders   ED Discharge Orders          Ordered    naproxen  (NAPROSYN ) 500 MG tablet  2 times daily with meals        07/19/24 0958    methocarbamol (ROBAXIN) 500 MG tablet  Every 8 hours PRN  07/19/24 0958             Note:  This document was prepared using Dragon voice recognition software and may include unintentional dictation errors.   Arlander Charleston, MD 07/19/24 1002  "

## 2024-07-26 ENCOUNTER — Ambulatory Visit: Admitting: Nurse Practitioner
